# Patient Record
Sex: Male | Born: 1965 | Race: White | Hispanic: No | Marital: Married | Smoking: Former smoker
Health system: Southern US, Community
[De-identification: ages and names within clinical notes are randomized; demographics above are authoritative.]

## PROBLEM LIST (undated history)

## (undated) DIAGNOSIS — I639 Cerebral infarction, unspecified: Secondary | ICD-10-CM

## (undated) DIAGNOSIS — F32A Depression, unspecified: Secondary | ICD-10-CM

## (undated) DIAGNOSIS — F329 Major depressive disorder, single episode, unspecified: Secondary | ICD-10-CM

## (undated) DIAGNOSIS — Q631 Lobulated, fused and horseshoe kidney: Secondary | ICD-10-CM

## (undated) DIAGNOSIS — I6529 Occlusion and stenosis of unspecified carotid artery: Secondary | ICD-10-CM

## (undated) HISTORY — PX: GANGLION CYST EXCISION: SHX1691

## (undated) HISTORY — DX: Occlusion and stenosis of unspecified carotid artery: I65.29

## (undated) HISTORY — DX: Depression, unspecified: F32.A

## (undated) HISTORY — PX: CRANIOTOMY: SHX93

## (undated) HISTORY — DX: Lobulated, fused and horseshoe kidney: Q63.1

---
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
## 2011-01-30 DIAGNOSIS — J159 Unspecified bacterial pneumonia: Secondary | ICD-10-CM | POA: Insufficient documentation

## 2011-12-26 DIAGNOSIS — IMO0002 Reserved for concepts with insufficient information to code with codable children: Secondary | ICD-10-CM | POA: Insufficient documentation

## 2014-06-15 DIAGNOSIS — G4700 Insomnia, unspecified: Secondary | ICD-10-CM | POA: Insufficient documentation

## 2016-06-09 DIAGNOSIS — F411 Generalized anxiety disorder: Secondary | ICD-10-CM | POA: Insufficient documentation

## 2016-06-09 DIAGNOSIS — Z6821 Body mass index (BMI) 21.0-21.9, adult: Secondary | ICD-10-CM | POA: Insufficient documentation

## 2016-06-09 DIAGNOSIS — M25562 Pain in left knee: Secondary | ICD-10-CM | POA: Insufficient documentation

## 2016-06-09 DIAGNOSIS — I10 Essential (primary) hypertension: Secondary | ICD-10-CM | POA: Insufficient documentation

## 2016-06-09 DIAGNOSIS — Z0000 Encounter for general adult medical examination without abnormal findings: Secondary | ICD-10-CM | POA: Insufficient documentation

## 2017-03-16 DIAGNOSIS — I639 Cerebral infarction, unspecified: Secondary | ICD-10-CM

## 2017-03-16 DIAGNOSIS — I610 Nontraumatic intracerebral hemorrhage in hemisphere, subcortical: Secondary | ICD-10-CM | POA: Insufficient documentation

## 2017-03-16 HISTORY — DX: Cerebral infarction, unspecified: I63.9

## 2017-03-17 DIAGNOSIS — I161 Hypertensive emergency: Secondary | ICD-10-CM | POA: Insufficient documentation

## 2017-03-17 DIAGNOSIS — G936 Cerebral edema: Secondary | ICD-10-CM | POA: Insufficient documentation

## 2017-03-26 DIAGNOSIS — E440 Moderate protein-calorie malnutrition: Secondary | ICD-10-CM | POA: Insufficient documentation

## 2017-03-30 DIAGNOSIS — L03113 Cellulitis of right upper limb: Secondary | ICD-10-CM | POA: Insufficient documentation

## 2017-05-19 DIAGNOSIS — I639 Cerebral infarction, unspecified: Secondary | ICD-10-CM | POA: Insufficient documentation

## 2017-08-26 DIAGNOSIS — J069 Acute upper respiratory infection, unspecified: Secondary | ICD-10-CM | POA: Insufficient documentation

## 2017-10-08 DIAGNOSIS — R600 Localized edema: Secondary | ICD-10-CM | POA: Insufficient documentation

## 2018-01-05 DIAGNOSIS — L0231 Cutaneous abscess of buttock: Secondary | ICD-10-CM | POA: Insufficient documentation

## 2018-07-02 DIAGNOSIS — B370 Candidal stomatitis: Secondary | ICD-10-CM | POA: Insufficient documentation

## 2018-07-09 DIAGNOSIS — R059 Cough, unspecified: Secondary | ICD-10-CM | POA: Insufficient documentation

## 2018-11-02 ENCOUNTER — Encounter: Payer: Self-pay | Admitting: Neurology

## 2018-11-02 ENCOUNTER — Encounter: Payer: Self-pay | Admitting: *Deleted

## 2018-11-02 ENCOUNTER — Other Ambulatory Visit: Payer: Self-pay

## 2018-11-02 ENCOUNTER — Telehealth: Payer: Self-pay | Admitting: Neurology

## 2018-11-02 ENCOUNTER — Ambulatory Visit: Payer: Managed Care, Other (non HMO) | Admitting: Neurology

## 2018-11-02 VITALS — BP 115/79 | HR 55 | Temp 99.3°F

## 2018-11-02 DIAGNOSIS — I69354 Hemiplegia and hemiparesis following cerebral infarction affecting left non-dominant side: Secondary | ICD-10-CM

## 2018-11-02 DIAGNOSIS — G8114 Spastic hemiplegia affecting left nondominant side: Secondary | ICD-10-CM | POA: Insufficient documentation

## 2018-11-02 NOTE — Telephone Encounter (Signed)
4 weeks emg guided botox

## 2018-11-02 NOTE — Progress Notes (Signed)
PATIENT: David Powers DOB: 06-May-1965  Chief Complaint  Patient presents with  . History of CVA    He is here with his wife, Katharine Look. He would like to discuss possible Botox treatment for his left, upper extremity spasticity.   Marland Kitchen PCP    Lanelle Bal, PA-C     HISTORICAL  David Powers is a 53 year old male, seen in request by primary care physician PA Lanelle Bal for evaluation of EMG guided botulism toxin injection for spastic left upper lower extremity, he is accompanied by his wife center at today's visit on November 02, 2018.  I have reviewed and summarized the referring note from the referring physician.  He had past medical history of hypertension, depression, suffered right hemisphere hemorrhagic stroke, presented with sudden onset left-sided weakness on March 16, 2017, was [facility] at [facility], right craniotomy, I reviewed MRI report in April 2019, right side craniotomy, large area of encephalomalacia centered within the right corona radiata with extension into the centrum semiovale and into the lentiform nucleus, external capsule, corresponding to site of previous parenchymal hemorrhage, moderate cerebellar and mild cerebral atrophy, large superficial cystic structure lateral at the right neck reflex sebaceous cyst  He has significant spastic hemiparesis, wheelchair-bound, needing help transfer, no significant movement of left upper extremity, he also complains of constant pain of left shoulder, elbow, hand, left lower extremity,  He is taking baclofen 10 mg 4 times a day, gabapentin 100 mg 3 times a day with some help,   REVIEW OF SYSTEMS: Full 14 system review of systems performed and notable only for as above All other review of systems were negative.  ALLERGIES: Allergies  Allergen Reactions  . Other Anaphylaxis    Itchy throat Hazelnuts  . Shellfish Allergy Anaphylaxis    HOME MEDICATIONS: Current Outpatient Medications  Medication Sig Dispense  Refill  . amLODipine (NORVASC) 10 MG tablet Take 10 mg by mouth daily.    . baclofen (LIORESAL) 10 MG tablet Take 10 mg by mouth 4 (four) times daily.    . Flaxseed, Linseed, (FLAXSEED OIL PO) Take by mouth daily.    Marland Kitchen FLUoxetine (PROZAC) 40 MG capsule Take by mouth daily.    . furosemide (LASIX) 40 MG tablet Take 40 mg by mouth daily.    Marland Kitchen gabapentin (NEURONTIN) 100 MG capsule Take by mouth.    Marland Kitchen HYDROcodone-acetaminophen (NORCO) 10-325 MG tablet Take 1 tablet by mouth 2 (two) times daily.    . traZODone (DESYREL) 100 MG tablet TAKE 1 TABLET BY MOUTH EVERYDAY AT BEDTIME     No current facility-administered medications for this visit.     PAST MEDICAL HISTORY: Past Medical History:  Diagnosis Date  . Depression   . Horseshoe kidney   . Stroke (cerebrum) ([place]) 03/16/2017    PAST SURGICAL HISTORY: Past Surgical History:  Procedure Laterality Date  . CRANIOTOMY     right frontal lobe  . GANGLION CYST EXCISION Left     FAMILY HISTORY: Family History  Problem Relation Age of Onset  . Heart disease Mother   . Other Father        auto accident  . Lymphoma Maternal Grandmother     SOCIAL HISTORY: Social History   Socioeconomic History  . Marital status: Married    Spouse name: Not on file  . Number of children: 1  . Years of education: college  . Highest education level: Not on file  Occupational History  . Occupation: Disabled  Social Needs  . Financial  resource strain: Not on file  . Food insecurity    Worry: Not on file    Inability: Not on file  . Transportation needs    Medical: Not on file    Non-medical: Not on file  Tobacco Use  . Smoking status: Former Smoker    Quit date: 2018    Years since quitting: 2.6  . Smokeless tobacco: Never Used  . Tobacco comment: previously smoked 2 ppd  Substance and Sexual Activity  . Alcohol use: Not Currently  . Drug use: Never  . Sexual activity: Not on file  Lifestyle  . Physical activity    Days per week: Not on  file    Minutes per session: Not on file  . Stress: Not on file  Relationships  . Social Herbalist on phone: Not on file    Gets together: Not on file    Attends religious service: Not on file    Active member of club or organization: Not on file    Attends meetings of clubs or organizations: Not on file    Relationship status: Not on file  . Intimate partner violence    Fear of current or ex partner: Not on file    Emotionally abused: Not on file    Physically abused: Not on file    Forced sexual activity: Not on file  Other Topics Concern  . Not on file  Social History Narrative   Lives at home with his wife.   Right-handed.   No caffeine per day.     PHYSICAL EXAM   Vitals:   11/02/18 1325  BP: 115/79  Pulse: (!) 55  Temp: 99.3 F (37.4 C)    Not recorded      There is no height or weight on file to calculate BMI.  PHYSICAL EXAMNIATION:  Gen: NAD, conversant, well nourised, obese, well groomed                     Cardiovascular: Regular rate rhythm, no peripheral edema, warm, nontender. Eyes: Conjunctivae clear without exudates or hemorrhage Neck: Supple, no carotid bruits. Pulmonary: Clear to auscultation bilaterally   NEUROLOGICAL EXAM:  MENTAL STATUS: Speech:    Speech is normal; fluent and spontaneous with normal comprehension.  Cognition:     Orientation to time, place and person     Normal recent and remote memory     Normal Attention span and concentration     Normal Language, naming, repeating,spontaneous speech     Fund of knowledge   CRANIAL NERVES: CN II: Visual fields are full to confrontation.  Pupils are round equal and briskly reactive to light. CN III, IV, VI: extraocular movement are normal. No ptosis. CN V: Facial sensation is intact to pinprick in all 3 divisions bilaterally. Corneal responses are intact.  CN VII: Face is symmetric with normal eye closure and smile. CN VIII: Hearing is normal to rubbing fingers CN IX,  X: Palate elevates symmetrically. Phonation is normal. CN XI: Head turning and shoulder shrug are intact CN XII: Tongue is midline with normal movements and no atrophy.  MOTOR: Spastic left hemiparesis, no significant left upper extremity, with passive movement left shoulder maximum 80 degrees, left elbow pronation flexion, maximum 170 degrees, wrist flexion thumb in position, with passive movement, near normal range of motion of wrist extension, finger extension,  Antigravity movement of left lower extremity, left ankle dorsiflexion weakness,  REFLEXES: Hyperreflexia on the left upper and lower  extremity  SENSORY: Intact to light touch  COORDINATION: Rapid alternating movements and fine finger movements are intact. There is no dysmetria on finger-to-nose and heel-knee-shin.    GAIT/STANCE: Deferred  DIAGNOSTIC DATA (LABS, IMAGING, TESTING) - I reviewed patient records, labs, notes, testing and imaging myself where available.   ASSESSMENT AND PLAN  Pesach Frisch is a 54 y.o. male   Spastic left hemiparesis  From large right hemisphere hemorrhagic stroke on March 16, 2017  Electrical stimulation guided Xeomin injection, asking for 600 units  Return to clinic in 1 months for injection  Marcial Pacas, M.D. Ph.D.  [facility] [address] [place], [place] [address] Ph: [phone] Fax: [phone]  CC: Lanelle Bal, [place]-

## 2018-11-03 NOTE — Telephone Encounter (Signed)
I called to schedule the patient but he did not answer so I left a VM asking him to call me back. DW

## 2018-11-24 NOTE — Telephone Encounter (Signed)
SY:[id number] (11/24/19). DW

## 2018-11-25 ENCOUNTER — Other Ambulatory Visit: Payer: Self-pay

## 2018-11-25 ENCOUNTER — Ambulatory Visit: Payer: Managed Care, Other (non HMO) | Admitting: Neurology

## 2018-11-25 VITALS — BP 129/76 | HR 64 | Temp 98.6°F

## 2018-11-25 DIAGNOSIS — I69354 Hemiplegia and hemiparesis following cerebral infarction affecting left non-dominant side: Secondary | ICD-10-CM

## 2018-11-25 MED ORDER — INCOBOTULINUMTOXINA 100 UNITS IM SOLR: 100.0000 [IU] | INTRAMUSCULAR | Status: DC

## 2018-11-25 MED ORDER — INCOBOTULINUMTOXINA 100 UNITS IM SOLR
600.0000 [IU] | INTRAMUSCULAR | Status: AC
  Administered 2018-11-25: 18:00:00 600 [IU] via INTRAMUSCULAR

## 2018-11-25 NOTE — Progress Notes (Signed)
PATIENT: David Powers DOB: 04/24/1965  Chief Complaint  Patient presents with  . xeomin    RM 13. Approved for 600U Buy/Bill. Left spastic hemiplegia.     HISTORICAL  David Powers is a 53 year old male, seen in request by primary care physician PA Lanelle Bal for evaluation of EMG guided botulism toxin injection for spastic left upper lower extremity, he is accompanied by his wife center at today's visit on November 02, 2018.  I have reviewed and summarized the referring note from the referring physician.  He had past medical history of hypertension, depression, suffered right hemisphere hemorrhagic stroke, presented with sudden onset left-sided weakness on March 16, 2017, was treated at [facility], right craniotomy, I reviewed MRI report in April 2019, right side craniotomy, large area of encephalomalacia centered within the right corona radiata with extension into the centrum semiovale and into the lentiform nucleus, external capsule, corresponding to site of previous parenchymal hemorrhage, moderate cerebellar and mild cerebral atrophy, large superficial cystic structure lateral at the right neck reflex sebaceous cyst  He has significant spastic hemiparesis, wheelchair-bound, needing help transfer, no significant movement of left upper extremity, he also complains of constant pain of left shoulder, elbow, hand, left lower extremity,  He is taking baclofen 10 mg 4 times a day, gabapentin 100 mg 3 times a day with some help,  Update November 25, 2018: This is his first electrical stimulation guided botulism toxin injection for spastic left upper and lower extremity spasticity, he complains significant left shoulder, elbow, wrist, finger pain with passive stretch, also have significant left knee, hamstring muscle tightness, we used Xeomin 600 units today  REVIEW OF SYSTEMS: Full 14 system review of systems performed and notable only for as above All other review of systems  were negative.  ALLERGIES: Allergies  Allergen Reactions  . Other Anaphylaxis    Itchy throat Hazelnuts  . Shellfish Allergy Anaphylaxis    HOME MEDICATIONS: Current Outpatient Medications  Medication Sig Dispense Refill  . amLODipine (NORVASC) 10 MG tablet Take 10 mg by mouth daily.    . baclofen (LIORESAL) 10 MG tablet Take 10 mg by mouth 4 (four) times daily.    . Flaxseed, Linseed, (FLAXSEED OIL PO) Take by mouth daily.    Marland Kitchen FLUoxetine (PROZAC) 40 MG capsule Take by mouth daily.    . furosemide (LASIX) 40 MG tablet Take 40 mg by mouth daily.    Marland Kitchen gabapentin (NEURONTIN) 100 MG capsule Take by mouth.    Marland Kitchen HYDROcodone-acetaminophen (NORCO) 10-325 MG tablet Take 1 tablet by mouth 2 (two) times daily.    . traZODone (DESYREL) 100 MG tablet TAKE 1 TABLET BY MOUTH EVERYDAY AT BEDTIME     No current facility-administered medications for this visit.     PAST MEDICAL HISTORY: Past Medical History:  Diagnosis Date  . Depression   . Horseshoe kidney   . Stroke (cerebrum) ([place]) 03/16/2017    PAST SURGICAL HISTORY: Past Surgical History:  Procedure Laterality Date  . CRANIOTOMY     right frontal lobe  . GANGLION CYST EXCISION Left     FAMILY HISTORY: Family History  Problem Relation Age of Onset  . Heart disease Mother   . Other Father        auto accident  . Lymphoma Maternal Grandmother     SOCIAL HISTORY: Social History   Socioeconomic History  . Marital status: Married    Spouse name: Not on file  . Number of children: 1  . Years  of education: college  . Highest education level: Not on file  Occupational History  . Occupation: Disabled  Social Needs  . Financial resource strain: Not on file  . Food insecurity    Worry: Not on file    Inability: Not on file  . Transportation needs    Medical: Not on file    Non-medical: Not on file  Tobacco Use  . Smoking status: Former Smoker    Quit date: 2018    Years since quitting: 2.6  . Smokeless tobacco: Never  Used  . Tobacco comment: previously smoked 2 ppd  Substance and Sexual Activity  . Alcohol use: Not Currently  . Drug use: Never  . Sexual activity: Not on file  Lifestyle  . Physical activity    Days per week: Not on file    Minutes per session: Not on file  . Stress: Not on file  Relationships  . Social Herbalist on phone: Not on file    Gets together: Not on file    Attends religious service: Not on file    Active member of club or organization: Not on file    Attends meetings of clubs or organizations: Not on file    Relationship status: Not on file  . Intimate partner violence    Fear of current or ex partner: Not on file    Emotionally abused: Not on file    Physically abused: Not on file    Forced sexual activity: Not on file  Other Topics Concern  . Not on file  Social History Narrative   Lives at home with his wife.   Right-handed.   No caffeine per day.     PHYSICAL EXAM   Vitals:   11/25/18 1030  BP: 129/76  Pulse: 64  Temp: 98.6 F (37 C)    Not recorded      There is no height or weight on file to calculate BMI.  PHYSICAL EXAMNIATION:  Gen: NAD, conversant, well nourised, well groomed                     Cardiovascular: Regular rate rhythm, no peripheral edema, warm, nontender. Eyes: Conjunctivae clear without exudates or hemorrhage Neck: Supple, no carotid bruits. Pulmonary: Clear to auscultation bilaterally   NEUROLOGICAL EXAM:  MENTAL STATUS: Speech:    Speech is normal; fluent and spontaneous with normal comprehension.  Cognition:     Orientation to time, place and person     Normal recent and remote memory     Normal Attention span and concentration     Normal Language, naming, repeating,spontaneous speech     Fund of knowledge   CRANIAL NERVES: CN II: Visual fields are full to confrontation.  Pupils are round equal and briskly reactive to light. CN III, IV, VI: extraocular movement are normal. No ptosis. CN V: Facial  sensation is intact to pinprick in all 3 divisions bilaterally. Corneal responses are intact.  CN VII: Face is symmetric with normal eye closure and smile. CN VIII: Hearing is normal to casual conversation CN IX, X: Palate elevates symmetrically. Phonation is normal. CN XI: Head turning and shoulder shrug are intact CN XII: Tongue is midline with normal movements and no atrophy.  MOTOR: Spastic left hemiparesis, no significant left upper extremity spontaneous movement, with passive movement left shoulder maximum 80 degrees, left elbow pronation flexion, maximum 170 degrees, wrist flexion thumb in position, with passive movement, near normal range of motion of  wrist and finger  Antigravity movement of left lower extremity, complains of left knee pain, left hamstring muscle tightness left ankle dorsiflexion weakness,  REFLEXES: Hyperreflexia on the left upper and lower extremity  SENSORY: Intact to light touch  COORDINATION: Rapid alternating movements and fine finger movements are intact. There is no dysmetria on finger-to-nose and heel-knee-shin.    GAIT/STANCE: Deferred  DIAGNOSTIC DATA (LABS, IMAGING, TESTING) - I reviewed patient records, labs, notes, testing and imaging myself where available.   ASSESSMENT AND PLAN  David Powers is a 53 y.o. male   Spastic left hemiparesis  From large right hemisphere hemorrhagic stroke on March 16, 2017  Electrical stimulation guided Xeomin injection, use 600 units today   Left pectoralis major 100 units Left latissimus dorsi 100 units Left brachialis 100 units  Left pronator teres 50 units Left flexor digitorum profundus 50 units Left flexor digitorum superficialis 50 units Left palmar longus 50 units Left flexor carpi ulnar wrist 50 units Left flexor carpi radialis 50 units  He tolerated injection well will return to clinic in 3 months for repeat injection  Marcial Pacas, M.D. Ph.D.  [facility] [address], [place], [place] [address] Ph: [phone] Fax: [phone]  CC: Lanelle Bal, [place]-

## 2019-03-03 ENCOUNTER — Ambulatory Visit: Payer: Self-pay | Admitting: Neurology

## 2019-08-17 DIAGNOSIS — F32A Depression, unspecified: Secondary | ICD-10-CM | POA: Insufficient documentation

## 2019-08-18 DIAGNOSIS — R5383 Other fatigue: Secondary | ICD-10-CM | POA: Insufficient documentation

## 2019-12-26 ENCOUNTER — Other Ambulatory Visit: Payer: Self-pay | Admitting: Otolaryngology

## 2019-12-26 ENCOUNTER — Other Ambulatory Visit (HOSPITAL_COMMUNITY): Payer: Self-pay | Admitting: Otolaryngology

## 2019-12-26 DIAGNOSIS — R221 Localized swelling, mass and lump, neck: Secondary | ICD-10-CM

## 2020-01-18 ENCOUNTER — Ambulatory Visit (HOSPITAL_COMMUNITY): Payer: Medicare Other

## 2020-01-27 ENCOUNTER — Ambulatory Visit (HOSPITAL_COMMUNITY)
Admission: RE | Admit: 2020-01-27 | Discharge: 2020-01-27 | Disposition: A | Discharge status: Home / Self Care | Payer: Medicare Other | Source: Ambulatory Visit | Attending: Otolaryngology | Admitting: Otolaryngology

## 2020-01-27 ENCOUNTER — Other Ambulatory Visit: Payer: Self-pay

## 2020-01-27 DIAGNOSIS — R221 Localized swelling, mass and lump, neck: Secondary | ICD-10-CM | POA: Diagnosis not present

## 2020-01-27 DIAGNOSIS — I6521 Occlusion and stenosis of right carotid artery: Secondary | ICD-10-CM | POA: Diagnosis not present

## 2020-01-27 LAB — POCT I-STAT CREATININE: Creatinine, Ser: 0.8 mg/dL (ref 0.61–1.24)

## 2020-01-27 MED ORDER — IOHEXOL 300 MG/ML  SOLN
75.0000 mL | Freq: Once | INTRAMUSCULAR | Status: AC | PRN
  Administered 2020-01-27: 75 mL via INTRAVENOUS

## 2020-02-01 DIAGNOSIS — R221 Localized swelling, mass and lump, neck: Secondary | ICD-10-CM | POA: Diagnosis not present

## 2020-02-01 DIAGNOSIS — H9313 Tinnitus, bilateral: Secondary | ICD-10-CM | POA: Diagnosis not present

## 2020-02-01 DIAGNOSIS — H903 Sensorineural hearing loss, bilateral: Secondary | ICD-10-CM | POA: Diagnosis not present

## 2020-02-21 DIAGNOSIS — L723 Sebaceous cyst: Secondary | ICD-10-CM | POA: Diagnosis not present

## 2020-02-21 DIAGNOSIS — I10 Essential (primary) hypertension: Secondary | ICD-10-CM | POA: Diagnosis not present

## 2020-02-21 DIAGNOSIS — M25562 Pain in left knee: Secondary | ICD-10-CM | POA: Diagnosis not present

## 2020-02-21 DIAGNOSIS — I69354 Hemiplegia and hemiparesis following cerebral infarction affecting left non-dominant side: Secondary | ICD-10-CM | POA: Diagnosis not present

## 2020-02-21 DIAGNOSIS — I639 Cerebral infarction, unspecified: Secondary | ICD-10-CM | POA: Diagnosis not present

## 2020-02-21 DIAGNOSIS — M25561 Pain in right knee: Secondary | ICD-10-CM | POA: Diagnosis not present

## 2020-03-12 DIAGNOSIS — Z8673 Personal history of transient ischemic attack (TIA), and cerebral infarction without residual deficits: Secondary | ICD-10-CM | POA: Diagnosis not present

## 2020-03-12 DIAGNOSIS — I10 Essential (primary) hypertension: Secondary | ICD-10-CM | POA: Diagnosis not present

## 2020-03-12 DIAGNOSIS — Z79899 Other long term (current) drug therapy: Secondary | ICD-10-CM | POA: Diagnosis not present

## 2020-03-12 DIAGNOSIS — Z743 Need for continuous supervision: Secondary | ICD-10-CM | POA: Diagnosis not present

## 2020-03-12 DIAGNOSIS — Z87891 Personal history of nicotine dependence: Secondary | ICD-10-CM | POA: Diagnosis not present

## 2020-03-12 DIAGNOSIS — K760 Fatty (change of) liver, not elsewhere classified: Secondary | ICD-10-CM | POA: Diagnosis not present

## 2020-03-12 DIAGNOSIS — Q631 Lobulated, fused and horseshoe kidney: Secondary | ICD-10-CM | POA: Diagnosis not present

## 2020-03-12 DIAGNOSIS — R531 Weakness: Secondary | ICD-10-CM | POA: Diagnosis not present

## 2020-03-12 DIAGNOSIS — N200 Calculus of kidney: Secondary | ICD-10-CM | POA: Diagnosis not present

## 2020-03-12 DIAGNOSIS — E876 Hypokalemia: Secondary | ICD-10-CM | POA: Diagnosis not present

## 2020-03-12 DIAGNOSIS — R509 Fever, unspecified: Secondary | ICD-10-CM | POA: Diagnosis not present

## 2020-03-12 DIAGNOSIS — I700 Atherosclerosis of aorta: Secondary | ICD-10-CM | POA: Diagnosis not present

## 2020-03-12 DIAGNOSIS — E860 Dehydration: Secondary | ICD-10-CM | POA: Diagnosis not present

## 2020-03-12 DIAGNOSIS — R5381 Other malaise: Secondary | ICD-10-CM | POA: Diagnosis not present

## 2020-03-12 DIAGNOSIS — R339 Retention of urine, unspecified: Secondary | ICD-10-CM | POA: Diagnosis not present

## 2020-03-15 DIAGNOSIS — Z7689 Persons encountering health services in other specified circumstances: Secondary | ICD-10-CM | POA: Diagnosis not present

## 2020-03-15 DIAGNOSIS — E876 Hypokalemia: Secondary | ICD-10-CM | POA: Diagnosis not present

## 2020-03-15 DIAGNOSIS — R339 Retention of urine, unspecified: Secondary | ICD-10-CM | POA: Diagnosis not present

## 2020-03-15 DIAGNOSIS — Z1389 Encounter for screening for other disorder: Secondary | ICD-10-CM | POA: Diagnosis not present

## 2020-03-15 DIAGNOSIS — K029 Dental caries, unspecified: Secondary | ICD-10-CM | POA: Diagnosis not present

## 2020-03-15 DIAGNOSIS — S22000A Wedge compression fracture of unspecified thoracic vertebra, initial encounter for closed fracture: Secondary | ICD-10-CM | POA: Diagnosis not present

## 2020-03-22 ENCOUNTER — Encounter: Payer: Self-pay | Admitting: *Deleted

## 2020-04-03 ENCOUNTER — Ambulatory Visit: Payer: Medicare Other | Admitting: Urology

## 2020-04-03 ENCOUNTER — Other Ambulatory Visit: Payer: Self-pay

## 2020-04-03 DIAGNOSIS — I6529 Occlusion and stenosis of unspecified carotid artery: Secondary | ICD-10-CM

## 2020-04-05 ENCOUNTER — Other Ambulatory Visit: Payer: Self-pay

## 2020-04-05 ENCOUNTER — Ambulatory Visit (INDEPENDENT_AMBULATORY_CARE_PROVIDER_SITE_OTHER): Payer: Medicare Other | Admitting: Urology

## 2020-04-05 VITALS — BP 99/69 | HR 58

## 2020-04-05 DIAGNOSIS — R339 Retention of urine, unspecified: Secondary | ICD-10-CM

## 2020-04-05 MED ORDER — TAMSULOSIN HCL 0.4 MG PO CAPS: 0.4000 mg | ORAL_CAPSULE | Freq: Every day | ORAL | 11 refills | Status: DC

## 2020-04-05 NOTE — Progress Notes (Signed)
Fill and Pull Catheter Removal  Patient is present today for a catheter removal.  Patient was cleaned and prepped in a sterile fashion 81ml of sterile water/ saline was instilled into the bladder when the patient felt the urge to urinate. 85ml of water was then drained from the balloon.  A 16FR foley cath was removed from the bladder no complications were noted .  Patient as then given some time to void on their own.  Patient can void  166ml on their own after some time.  Patient tolerated well.  Performed by: Estill Bamberg RN  Follow up/ Additional notes: Monday for PVR    Urological Symptom Review      Patient is experiencing the following symptoms: Trouble starting stream Blood in urine Erection problems (male only)   Review of Systems  Gastrointestinal (upper)  : Negative for upper GI symptoms  Gastrointestinal (lower) : Constipation  Constitutional : Weight loss  Skin: Negative for skin symptoms  Eyes: Negative for eye symptoms  Ear/Nose/Throat : Negative for Ear/Nose/Throat symptoms  Hematologic/Lymphatic: Negative for Hematologic/Lymphatic symptoms  Cardiovascular : Leg swelling  Respiratory : Negative for respiratory symptoms  Endocrine: Negative for endocrine symptoms  Musculoskeletal: Back pain  Neurological: Negative for neurological symptoms  Psychologic: Depression Anxiety

## 2020-04-05 NOTE — Progress Notes (Signed)
04/05/2020 9:51 AM   David Powers Feb 16, 1966 [id number]  Referring provider: Lanelle Bal, PA-C [name],  [place] [address]  Urinary retention  HPI: [name] is a 55yo here for evaluation of urinary retention. He was seen in the ER on 03/12/2020 for urinary retention and a foley was placed at that time. 600cc drained at that time. Patient was dehydrated. Since 2018 when he had his CVA he has had a weak urinary stream, urinary hesitancy, starting/stopping. No nocturia. No urinary urgency or frequency.    PMH: Past Medical History:  Diagnosis Date  . Depression   . Horseshoe kidney   . Stroke (cerebrum) ([place]) 03/16/2017    Surgical History: Past Surgical History:  Procedure Laterality Date  . CRANIOTOMY     right frontal lobe  . GANGLION CYST EXCISION Left     Home Medications:  Allergies as of 04/05/2020      Reactions   Other Anaphylaxis   Itchy throat Hazelnuts   Shellfish Allergy Anaphylaxis   Shrimp Extract Allergy Skin Test Anaphylaxis      Medication List       Accurate as of April 05, 2020  9:51 AM. If you have any questions, ask your nurse or doctor.        STOP taking these medications   FLAXSEED OIL PO Stopped by: Nicolette Bang, MD   FLUoxetine 40 MG capsule Commonly known as: PROZAC Stopped by: Nicolette Bang, MD     TAKE these medications   amLODipine 10 MG tablet Commonly known as: NORVASC Take 10 mg by mouth daily.   baclofen 10 MG tablet Commonly known as: LIORESAL Take 10 mg by mouth 4 (four) times daily.   DULoxetine 60 MG capsule Commonly known as: CYMBALTA Cymbalta 60 mg capsule,delayed release   ELIQUIS PO Take by mouth.   furosemide 40 MG tablet Commonly known as: LASIX Take 40 mg by mouth daily.   gabapentin 100 MG capsule Commonly known as: NEURONTIN Take by mouth.   HYDROcodone-acetaminophen 10-325 MG tablet Commonly known as: NORCO Take 1 tablet by mouth 2 (two) times daily.   Metoprolol  Succinate 50 MG Cs24 metoprolol succinate ER 50 mg capsule sprinkle, ext. release 24 hr   traZODone 100 MG tablet Commonly known as: DESYREL TAKE 1 TABLET BY MOUTH EVERYDAY AT BEDTIME       Allergies:  Allergies  Allergen Reactions  . Other Anaphylaxis    Itchy throat Hazelnuts  . Shellfish Allergy Anaphylaxis  . Shrimp Extract Allergy Skin Test Anaphylaxis    Family History: Family History  Problem Relation Age of Onset  . Heart disease Mother   . Other Father        auto accident  . Lymphoma Maternal Grandmother     Social History:  reports that he quit smoking about 4 years ago. He has never used smokeless tobacco. He reports previous alcohol use. He reports that he does not use drugs.  ROS: All other review of systems were reviewed and are negative except what is noted above in HPI  Physical Exam: BP 99/69   Pulse (!) 58   Constitutional:  Alert and oriented, No acute distress. HEENT:  AT, moist mucus membranes.  Trachea midline, no masses. Cardiovascular: No clubbing, cyanosis, or edema. Respiratory: Normal respiratory effort, no increased work of breathing. GI: Abdomen is soft, nontender, nondistended, no abdominal masses GU: No CVA tenderness.  Lymph: No cervical or inguinal lymphadenopathy. Skin: No rashes, bruises or suspicious lesions. Neurologic:  Grossly intact, no focal deficits, moving all 4 extremities. Psychiatric: Normal mood and affect.  Laboratory Data: No results found for: WBC, HGB, HCT, MCV, PLT  Lab Results  Component Value Date   CREATININE 0.80 01/27/2020    No results found for: PSA  No results found for: TESTOSTERONE  No results found for: HGBA1C  Urinalysis No results found for: COLORURINE, APPEARANCEUR, LABSPEC, PHURINE, GLUCOSEU, HGBUR, BILIRUBINUR, KETONESUR, PROTEINUR, UROBILINOGEN, NITRITE, LEUKOCYTESUR  No results found for: LABMICR, WBCUA, RBCUA, LABEPIT, MUCUS, BACTERIA  Pertinent Imaging:  No results found for  this or any previous visit.  No results found for this or any previous visit.  No results found for this or any previous visit.  No results found for this or any previous visit.  No results found for this or any previous visit.  No results found for this or any previous visit.  No results found for this or any previous visit.  No results found for this or any previous visit.   Assessment & Plan:    1. Urinary retention -We will start flomax 0.4mg  daily -Voiding trial passed today. RTC 3 days for PVR   No follow-ups on file.  Nicolette Bang, MD  [facility] [place]

## 2020-04-05 NOTE — Patient Instructions (Signed)
Acute Urinary Retention, Male  Acute urinary retention is when a person cannot pee (urinate) at all, or can only pee a little. This can come on all of a sudden. If it is not treated, it can lead to kidney problems or other serious problems. What are the causes?  A problem with the tube that drains the bladder (urethra).  Problems with the nerves in the bladder.  Tumors.  Certain medicines.  An infection.  Having trouble pooping (constipation). What increases the risk? Older men are more at risk because their prostate gland may become larger as they age. Other conditions also can increase risk. These include:  Diseases, such as multiple sclerosis.  Injury to the spinal cord.  Diabetes.  A condition that affects the way the brain works, such as dementia.  Holding back urine due to trauma or because you do not want to use the bathroom. What are the signs or symptoms?  Trouble peeing.  Pain in the lower belly. How is this treated? Treatment for this condition may include:  Medicines.  Placing a thin, germ-free tube (catheter) into the bladder to drain pee out of the body.  Therapy to treat mental health conditions.  Treatment for conditions that may cause this. If needed, you may be treated in the hospital for kidney problems or to manage other problems. Follow these instructions at home: Medicines  Take over-the-counter and prescription medicines only as told by your doctor. Ask your doctor what medicines you should stay away from.  If you were given an antibiotic medicine, take it as told by your doctor. Do not stop taking it, even if you start to feel better. General instructions  Do not smoke or use any products that contain nicotine or tobacco. If you need help quitting, ask your doctor.  Drink enough fluid to keep your pee pale yellow.  If you were sent home with a tube that drains the bladder, take care of it as told by your doctor.  Watch for changes in  your symptoms. Tell your doctor about them.  If told, keep track of changes in your blood pressure at home. Tell your doctor about them.  Keep all follow-up visits. Contact a doctor if:  You have spasms in your bladder that you cannot stop.  You leak pee when you have spasms. Get help right away if:  You have chills or a fever.  You have blood in your pee.  You have a tube that drains pee from the bladder and these things happen: ? The tube stops draining pee. ? The tube falls out. Summary  Acute urinary retention is when you cannot pee at all or you pee too little.  If this condition is not treated, it can lead to kidney problems or other serious problems.  If you were sent home with a tube (catheter) that drains the bladder, take care of it as told by your doctor.  Watch for changes in your symptoms. Tell your doctor about them. This information is not intended to replace advice given to you by your health care provider. Make sure you discuss any questions you have with your health care provider. Document Revised: 11/23/2019 Document Reviewed: 11/23/2019 Elsevier Patient Education  2021 [employer]vier Inc.  

## 2020-04-06 ENCOUNTER — Ambulatory Visit: Payer: Medicare Other | Admitting: Urology

## 2020-04-09 ENCOUNTER — Ambulatory Visit (INDEPENDENT_AMBULATORY_CARE_PROVIDER_SITE_OTHER): Payer: Medicare Other

## 2020-04-09 ENCOUNTER — Other Ambulatory Visit: Payer: Self-pay

## 2020-04-09 DIAGNOSIS — R339 Retention of urine, unspecified: Secondary | ICD-10-CM | POA: Diagnosis not present

## 2020-04-09 LAB — BLADDER SCAN AMB NON-IMAGING: Scan Result: 237

## 2020-04-09 NOTE — Progress Notes (Signed)
Bladder Scan Patient can void: 237 ml Performed By:  Nakaya Mishkin,lpn   Return Friday for PVR

## 2020-04-10 ENCOUNTER — Encounter: Payer: Self-pay | Admitting: Urology

## 2020-04-13 ENCOUNTER — Ambulatory Visit (INDEPENDENT_AMBULATORY_CARE_PROVIDER_SITE_OTHER): Payer: Medicare Other

## 2020-04-13 ENCOUNTER — Other Ambulatory Visit: Payer: Self-pay

## 2020-04-13 DIAGNOSIS — R339 Retention of urine, unspecified: Secondary | ICD-10-CM

## 2020-04-13 NOTE — Progress Notes (Signed)
Bladder Scan Patient can void: 101 ml Performed By: Estill Bamberg RN   1 month ov with MD PVR

## 2020-04-20 ENCOUNTER — Ambulatory Visit: Payer: Medicare Other | Admitting: Vascular Surgery

## 2020-04-20 ENCOUNTER — Encounter: Payer: Self-pay | Admitting: Vascular Surgery

## 2020-04-20 ENCOUNTER — Ambulatory Visit (HOSPITAL_COMMUNITY)
Admission: RE | Admit: 2020-04-20 | Discharge: 2020-04-20 | Disposition: A | Discharge status: Home / Self Care | Payer: Medicare Other | Source: Ambulatory Visit | Attending: Vascular Surgery | Admitting: Vascular Surgery

## 2020-04-20 ENCOUNTER — Other Ambulatory Visit: Payer: Self-pay

## 2020-04-20 VITALS — BP 125/85 | HR 57 | Temp 97.7°F | Resp 18

## 2020-04-20 DIAGNOSIS — I6529 Occlusion and stenosis of unspecified carotid artery: Secondary | ICD-10-CM | POA: Diagnosis not present

## 2020-04-20 NOTE — Progress Notes (Signed)
Patient ID: David Powers, male   DOB: 12/18/1965, 55 y.o.   MRN: [id number]  Reason for Consult: New Patient (Initial Visit)   Referred by Lanelle Bal, PA-C  Subjective:     HPI:  David Powers is a 55 y.o. male here for evaluation of carotid artery disease.  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  He does have swelling of his left lower extremity currently being treated for DVT in left lower extremity.  He underwent CT scan for cyst in his neck was found to have tight stenosis of the right ICA.  He has not had any new neurologic deficits.  Currently takes Eliquis does not take any antiplatelet agents.  Past Medical History:  Diagnosis Date  . Carotid artery occlusion   . Depression   . Horseshoe kidney   . Stroke (cerebrum) ([place]) 03/16/2017   Family History  Problem Relation Age of Onset  . Heart disease Mother   . Other Father        auto accident  . Lymphoma Maternal Grandmother    Past Surgical History:  Procedure Laterality Date  . CRANIOTOMY     right frontal lobe  . GANGLION CYST EXCISION Left     Short Social History:  Social History   Tobacco Use  . Smoking status: Former Smoker    Quit date: 2018    Years since quitting: 4.0  . Smokeless tobacco: Never Used  . Tobacco comment: previously smoked 2 ppd  Substance Use Topics  . Alcohol use: Not Currently    Allergies  Allergen Reactions  . Other Anaphylaxis    Itchy throat Hazelnuts  . Shellfish Allergy Anaphylaxis  . Shrimp Extract Allergy Skin Test Anaphylaxis    Current Outpatient Medications  Medication Sig Dispense Refill  . amLODipine (NORVASC) 10 MG tablet Take 10 mg by mouth daily.    Marland Kitchen Apixaban (ELIQUIS PO) Take by mouth.    . baclofen (LIORESAL) 10 MG tablet Take 10 mg by mouth 4 (four) times daily.    . DULoxetine (CYMBALTA) 60 MG capsule  Cymbalta 60 mg capsule,delayed release    . furosemide (LASIX) 40 MG tablet Take 40 mg by mouth daily.    Marland Kitchen gabapentin (NEURONTIN) 100 MG capsule Take by mouth.    Marland Kitchen HYDROcodone-acetaminophen (NORCO) 10-325 MG tablet Take 1 tablet by mouth 2 (two) times daily.    . Metoprolol Succinate 50 MG CS24 metoprolol succinate ER 50 mg capsule sprinkle, ext. release 24 hr    . tamsulosin (FLOMAX) 0.4 MG CAPS capsule Take 1 capsule (0.4 mg total) by mouth daily. 30 capsule 11  . traZODone (DESYREL) 100 MG tablet TAKE 1 TABLET BY MOUTH EVERYDAY AT BEDTIME     Current Facility-Administered Medications  Medication Dose Route Frequency Provider Last Rate Last Admin  . incobotulinumtoxinA (XEOMIN) 100 units injection 600 Units  600 Units Intramuscular Q90 days Marcial Pacas, MD   600 Units at 11/25/18 1747    Review of Systems  Constitutional:  Constitutional negative. HENT: HENT negative.  Eyes: Eyes negative.  Respiratory: Respiratory negative.  Cardiovascular: Positive for leg swelling.  GU: Positive for hematuria.  Skin: Skin negative.  Neurological: Positive for focal weakness and numbness.  Hematologic: Hematologic/lymphatic negative.  Psychiatric: Positive for depressed mood.        Objective:  Objective   Vitals:  04/20/20 1138  BP: 125/85  Pulse: (!) 57  Resp: 18  Temp: 97.7 F (36.5 C)  SpO2: 96%   There is no height or weight on file to calculate BMI.  Physical Exam HENT:     Head: Normocephalic.     Nose:     Comments: Wearing a mask Eyes:     Pupils: Pupils are equal, round, and reactive to light.  Neck:     Vascular: No carotid bruit.  Cardiovascular:     Rate and Rhythm: Normal rate.     Heart sounds: Normal heart sounds.  Pulmonary:     Effort: Pulmonary effort is normal.     Breath sounds: Normal breath sounds.  Abdominal:     General: Abdomen is flat.     Palpations: Abdomen is soft. There is no mass.  Musculoskeletal:     Right lower leg: No edema.      Left lower leg: Edema present.  Skin:    General: Skin is warm.     Capillary Refill: Capillary refill takes less than 2 seconds.  Neurological:     Mental Status: He is alert.     Comments: Paralysis left upper and lower extremity  Psychiatric:        Mood and Affect: Mood normal.        Behavior: Behavior normal.        Thought Content: Thought content normal.        Judgment: Judgment normal.     Data: CT IMPRESSION: 1. No evidence of mass or lymphadenopathy. 2. Atherosclerotic disease at the carotid bifurcations. Stenosis of the right ICA in the bulb with minimal diameter of 1.8 mm. This could be a hemodynamically significant stenosis.  Carotid Duplex I have independently interpreted his carotid duplex which demonstrates right-sided 60 to 79% stenosis that is homogeneous and left-sided 1 to 39% stenosis that is heterogeneous.  Bilateral vertebral arteries with antegrade flow     Assessment/Plan:    55 year old male with history of hemorrhagic stroke now with left-sided paralysis.  We discussed with the signs and symptoms of new stroke could be given that he has right-sided moderate grade stenosis 60 to 79% although is homogeneous.  He will follow-up in 1 year with repeat carotid duplex     Waynetta Sandy MD [facility]

## 2020-05-18 ENCOUNTER — Ambulatory Visit: Payer: Medicare Other | Admitting: Urology

## 2020-05-18 DIAGNOSIS — R5383 Other fatigue: Secondary | ICD-10-CM | POA: Diagnosis not present

## 2020-05-18 DIAGNOSIS — I69354 Hemiplegia and hemiparesis following cerebral infarction affecting left non-dominant side: Secondary | ICD-10-CM | POA: Diagnosis not present

## 2020-05-18 DIAGNOSIS — M25572 Pain in left ankle and joints of left foot: Secondary | ICD-10-CM | POA: Diagnosis not present

## 2020-05-18 DIAGNOSIS — I10 Essential (primary) hypertension: Secondary | ICD-10-CM | POA: Diagnosis not present

## 2020-05-18 DIAGNOSIS — M25562 Pain in left knee: Secondary | ICD-10-CM | POA: Diagnosis not present

## 2020-05-18 DIAGNOSIS — G4700 Insomnia, unspecified: Secondary | ICD-10-CM | POA: Diagnosis not present

## 2020-05-18 DIAGNOSIS — Z23 Encounter for immunization: Secondary | ICD-10-CM | POA: Diagnosis not present

## 2020-05-18 DIAGNOSIS — M25561 Pain in right knee: Secondary | ICD-10-CM | POA: Diagnosis not present

## 2020-05-18 DIAGNOSIS — I639 Cerebral infarction, unspecified: Secondary | ICD-10-CM | POA: Diagnosis not present

## 2020-05-18 DIAGNOSIS — R339 Retention of urine, unspecified: Secondary | ICD-10-CM

## 2020-05-18 DIAGNOSIS — L723 Sebaceous cyst: Secondary | ICD-10-CM | POA: Diagnosis not present

## 2020-05-18 DIAGNOSIS — I82402 Acute embolism and thrombosis of unspecified deep veins of left lower extremity: Secondary | ICD-10-CM | POA: Diagnosis not present

## 2020-05-23 ENCOUNTER — Ambulatory Visit: Payer: Medicare Other | Admitting: Urology

## 2020-05-24 ENCOUNTER — Telehealth: Payer: Self-pay

## 2020-05-24 NOTE — Telephone Encounter (Signed)
Patient's wife Katharine Look calling about patients rescheduled appt. Patient had a death in the family and had to cancel his appt on the 9th w/ [facility]. Notes: Return in about 3 days (around 04/08/2020) for [place] PVR. See MD in 4 weeks with PVR.  Please call Katharine Look back at [phone] with an appointment time.  [facility] appointments are full, not sure where the appt can be put.  Thanks, Helene Kelp

## 2020-05-25 NOTE — Telephone Encounter (Signed)
Pts wife notified of new appointment and time.

## 2020-05-29 DIAGNOSIS — G8929 Other chronic pain: Secondary | ICD-10-CM | POA: Diagnosis not present

## 2020-05-29 DIAGNOSIS — M25562 Pain in left knee: Secondary | ICD-10-CM | POA: Diagnosis not present

## 2020-05-29 DIAGNOSIS — M25561 Pain in right knee: Secondary | ICD-10-CM | POA: Diagnosis not present

## 2020-06-04 DIAGNOSIS — R519 Headache, unspecified: Secondary | ICD-10-CM | POA: Diagnosis not present

## 2020-06-04 DIAGNOSIS — Z8673 Personal history of transient ischemic attack (TIA), and cerebral infarction without residual deficits: Secondary | ICD-10-CM | POA: Diagnosis not present

## 2020-06-05 ENCOUNTER — Ambulatory Visit (INDEPENDENT_AMBULATORY_CARE_PROVIDER_SITE_OTHER): Payer: Medicare Other | Admitting: Urology

## 2020-06-05 ENCOUNTER — Encounter: Payer: Self-pay | Admitting: Urology

## 2020-06-05 ENCOUNTER — Other Ambulatory Visit: Payer: Self-pay

## 2020-06-05 VITALS — BP 114/80 | HR 73 | Ht 68.0 in

## 2020-06-05 DIAGNOSIS — N401 Enlarged prostate with lower urinary tract symptoms: Secondary | ICD-10-CM

## 2020-06-05 DIAGNOSIS — R339 Retention of urine, unspecified: Secondary | ICD-10-CM | POA: Diagnosis not present

## 2020-06-05 DIAGNOSIS — N138 Other obstructive and reflux uropathy: Secondary | ICD-10-CM | POA: Diagnosis not present

## 2020-06-05 LAB — BLADDER SCAN AMB NON-IMAGING: Scan Result: >434

## 2020-06-05 MED ORDER — TAMSULOSIN HCL 0.4 MG PO CAPS: 0.4000 mg | ORAL_CAPSULE | Freq: Two times a day (BID) | ORAL | 11 refills | Status: AC

## 2020-06-05 NOTE — Patient Instructions (Signed)

## 2020-06-05 NOTE — Progress Notes (Signed)
06/05/2020 2:52 PM   David Powers 13-May-1965 [id number]  Referring provider: Lanelle Bal, PA-C [address],  [place] [address]  followup urinary retention  HPI: David Powers is a 55yo here for followup for urinary retention. PVR 434cc. He is currently on flomax 0.4mg  daily. He does not feel his bladder is full. He has worsening urinary urgency, frequency, nocturia 3-4x, weak stream. He does not want a foley placed today.    PMH: Past Medical History:  Diagnosis Date  . Carotid artery occlusion   . Depression   . Horseshoe kidney   . Stroke (cerebrum) ([place]) 03/16/2017    Surgical History: Past Surgical History:  Procedure Laterality Date  . CRANIOTOMY     right frontal lobe  . GANGLION CYST EXCISION Left     Home Medications:  Allergies as of 06/05/2020      Reactions   Other Anaphylaxis   Itchy throat Hazelnuts   Shellfish Allergy Anaphylaxis   Shrimp Extract Allergy Skin Test Anaphylaxis      Medication List       Accurate as of June 05, 2020  2:52 PM. If you have any questions, ask your nurse or doctor.        amLODipine 10 MG tablet Commonly known as: NORVASC Take 10 mg by mouth daily.   baclofen 10 MG tablet Commonly known as: LIORESAL Take 10 mg by mouth 4 (four) times daily.   DULoxetine 60 MG capsule Commonly known as: CYMBALTA Cymbalta 60 mg capsule,delayed release   ELIQUIS PO Take by mouth.   Eliquis 5 MG Tabs tablet Generic drug: apixaban   furosemide 40 MG tablet Commonly known as: LASIX Take 40 mg by mouth daily.   gabapentin 100 MG capsule Commonly known as: NEURONTIN Take by mouth.   HYDROcodone-acetaminophen 10-325 MG tablet Commonly known as: NORCO Take 1 tablet by mouth 2 (two) times daily.   Metoprolol Succinate 50 MG Cs24 metoprolol succinate ER 50 mg capsule sprinkle, ext. release 24 hr   tamsulosin 0.4 MG Caps capsule Commonly known as: FLOMAX Take 1 capsule (0.4 mg total) by mouth daily.   traZODone  100 MG tablet Commonly known as: DESYREL TAKE 1 TABLET BY MOUTH EVERYDAY AT BEDTIME       Allergies:  Allergies  Allergen Reactions  . Other Anaphylaxis    Itchy throat Hazelnuts  . Shellfish Allergy Anaphylaxis  . Shrimp Extract Allergy Skin Test Anaphylaxis    Family History: Family History  Problem Relation Age of Onset  . Heart disease Mother   . Other Father        auto accident  . Lymphoma Maternal Grandmother     Social History:  reports that he quit smoking about 4 years ago. He has never used smokeless tobacco. He reports previous alcohol use. He reports that he does not use drugs.  ROS: All other review of systems were reviewed and are negative except what is noted above in HPI  Physical Exam: BP 114/80   Pulse 73   Ht 5\' 8"  (1.727 m)   Constitutional:  Alert and oriented, No acute distress. HEENT: [place] AT, moist mucus membranes.  Trachea midline, no masses. Cardiovascular: No clubbing, cyanosis, or edema. Respiratory: Normal respiratory effort, no increased work of breathing. GI: Abdomen is soft, nontender, nondistended, no abdominal masses GU: No CVA tenderness.  Lymph: No cervical or inguinal lymphadenopathy. Skin: No rashes, bruises or suspicious lesions. Neurologic: Grossly intact, no focal deficits, moving all 4 extremities. Psychiatric: Normal  mood and affect.  Laboratory Data: No results found for: WBC, HGB, HCT, MCV, PLT  Lab Results  Component Value Date   CREATININE 0.80 01/27/2020    No results found for: PSA  No results found for: TESTOSTERONE  No results found for: HGBA1C  Urinalysis No results found for: COLORURINE, APPEARANCEUR, LABSPEC, PHURINE, GLUCOSEU, HGBUR, BILIRUBINUR, KETONESUR, PROTEINUR, UROBILINOGEN, NITRITE, LEUKOCYTESUR  No results found for: LABMICR, WBCUA, RBCUA, LABEPIT, MUCUS, BACTERIA  Pertinent Imaging:  No results found for this or any previous visit.  No results found for this or any previous visit.  No  results found for this or any previous visit.  No results found for this or any previous visit.  No results found for this or any previous visit.  No results found for this or any previous visit.  No results found for this or any previous visit.  No results found for this or any previous visit.   Assessment & Plan:    1. Urinary retention -increase flomax to BID - BLADDER SCAN AMB NON-IMAGING - Urinalysis, Routine w reflex microscopic  2. Benign prostatic hyperplasia with urinary obstruction -Increase flomax to BID -RTC 1 week for PVR   No follow-ups on file.  Nicolette Bang, MD  [facility] [place] ,[place]

## 2020-06-05 NOTE — Progress Notes (Signed)
Bladder Scan Patient cannot void: 434 ml Performed By: Durenda Guthrie, lpn   Urological Symptom Review  Patient is experiencing the following symptoms: Leakage of urine Trouble starting stream Blood in urine   Review of Systems  Gastrointestinal (upper)  : Negative for upper GI symptoms  Gastrointestinal (lower) : Constipation  Constitutional : Weight loss  Skin: Negative for skin symptoms  Eyes: Negative for eye symptoms  Ear/Nose/Throat : Negative for Ear/Nose/Throat symptoms  Hematologic/Lymphatic: Negative for Hematologic/Lymphatic symptoms  Cardiovascular : Leg swelling  Respiratory : Negative for respiratory symptoms  Endocrine: Negative for endocrine symptoms  Musculoskeletal: Back pain Joint pain  Neurological: Headaches  Psychologic: Negative for psychiatric symptoms

## 2020-06-12 ENCOUNTER — Ambulatory Visit: Payer: Medicare Other

## 2020-06-22 ENCOUNTER — Other Ambulatory Visit: Payer: Self-pay

## 2020-06-22 ENCOUNTER — Ambulatory Visit (INDEPENDENT_AMBULATORY_CARE_PROVIDER_SITE_OTHER): Payer: Medicare Other

## 2020-06-22 DIAGNOSIS — R339 Retention of urine, unspecified: Secondary | ICD-10-CM | POA: Diagnosis not present

## 2020-06-22 DIAGNOSIS — N138 Other obstructive and reflux uropathy: Secondary | ICD-10-CM

## 2020-06-22 DIAGNOSIS — N401 Enlarged prostate with lower urinary tract symptoms: Secondary | ICD-10-CM

## 2020-06-22 NOTE — Addendum Note (Signed)
Addended by: Dorisann Frames on: 06/22/2020 10:56 AM   Modules accepted: Orders

## 2020-06-22 NOTE — Progress Notes (Signed)
Bladder Scan Patient can void: 89 ml Performed By: Estill Bamberg RN  Patient will keep scheduled appointment.

## 2020-06-27 DIAGNOSIS — R946 Abnormal results of thyroid function studies: Secondary | ICD-10-CM | POA: Diagnosis not present

## 2020-06-27 DIAGNOSIS — Z1329 Encounter for screening for other suspected endocrine disorder: Secondary | ICD-10-CM | POA: Diagnosis not present

## 2020-08-15 DIAGNOSIS — E440 Moderate protein-calorie malnutrition: Secondary | ICD-10-CM | POA: Diagnosis not present

## 2020-08-15 DIAGNOSIS — I6523 Occlusion and stenosis of bilateral carotid arteries: Secondary | ICD-10-CM | POA: Diagnosis not present

## 2020-08-15 DIAGNOSIS — R6889 Other general symptoms and signs: Secondary | ICD-10-CM | POA: Diagnosis not present

## 2020-08-15 DIAGNOSIS — E43 Unspecified severe protein-calorie malnutrition: Secondary | ICD-10-CM | POA: Diagnosis not present

## 2020-08-15 DIAGNOSIS — J9811 Atelectasis: Secondary | ICD-10-CM | POA: Diagnosis not present

## 2020-08-15 DIAGNOSIS — R188 Other ascites: Secondary | ICD-10-CM | POA: Diagnosis not present

## 2020-08-15 DIAGNOSIS — I82502 Chronic embolism and thrombosis of unspecified deep veins of left lower extremity: Secondary | ICD-10-CM | POA: Diagnosis not present

## 2020-08-15 DIAGNOSIS — D72829 Elevated white blood cell count, unspecified: Secondary | ICD-10-CM | POA: Diagnosis not present

## 2020-08-15 DIAGNOSIS — J0100 Acute maxillary sinusitis, unspecified: Secondary | ICD-10-CM | POA: Diagnosis not present

## 2020-08-15 DIAGNOSIS — Z9889 Other specified postprocedural states: Secondary | ICD-10-CM | POA: Diagnosis not present

## 2020-08-15 DIAGNOSIS — R0902 Hypoxemia: Secondary | ICD-10-CM | POA: Diagnosis not present

## 2020-08-15 DIAGNOSIS — Q631 Lobulated, fused and horseshoe kidney: Secondary | ICD-10-CM | POA: Diagnosis not present

## 2020-08-15 DIAGNOSIS — E8809 Other disorders of plasma-protein metabolism, not elsewhere classified: Secondary | ICD-10-CM | POA: Diagnosis not present

## 2020-08-15 DIAGNOSIS — Z86718 Personal history of other venous thrombosis and embolism: Secondary | ICD-10-CM | POA: Diagnosis not present

## 2020-08-15 DIAGNOSIS — K148 Other diseases of tongue: Secondary | ICD-10-CM | POA: Diagnosis not present

## 2020-08-15 DIAGNOSIS — E786 Lipoprotein deficiency: Secondary | ICD-10-CM | POA: Diagnosis not present

## 2020-08-15 DIAGNOSIS — C770 Secondary and unspecified malignant neoplasm of lymph nodes of head, face and neck: Secondary | ICD-10-CM | POA: Diagnosis not present

## 2020-08-15 DIAGNOSIS — R7881 Bacteremia: Secondary | ICD-10-CM | POA: Diagnosis not present

## 2020-08-15 DIAGNOSIS — K1370 Unspecified lesions of oral mucosa: Secondary | ICD-10-CM | POA: Diagnosis not present

## 2020-08-15 DIAGNOSIS — E860 Dehydration: Secondary | ICD-10-CM | POA: Diagnosis not present

## 2020-08-15 DIAGNOSIS — I69998 Other sequelae following unspecified cerebrovascular disease: Secondary | ICD-10-CM | POA: Diagnosis not present

## 2020-08-15 DIAGNOSIS — N200 Calculus of kidney: Secondary | ICD-10-CM | POA: Diagnosis not present

## 2020-08-15 DIAGNOSIS — B957 Other staphylococcus as the cause of diseases classified elsewhere: Secondary | ICD-10-CM | POA: Diagnosis not present

## 2020-08-15 DIAGNOSIS — Z9911 Dependence on respirator [ventilator] status: Secondary | ICD-10-CM | POA: Diagnosis not present

## 2020-08-15 DIAGNOSIS — C01 Malignant neoplasm of base of tongue: Secondary | ICD-10-CM | POA: Diagnosis not present

## 2020-08-15 DIAGNOSIS — K631 Perforation of intestine (nontraumatic): Secondary | ICD-10-CM | POA: Diagnosis not present

## 2020-08-15 DIAGNOSIS — J90 Pleural effusion, not elsewhere classified: Secondary | ICD-10-CM | POA: Diagnosis not present

## 2020-08-15 DIAGNOSIS — Z79899 Other long term (current) drug therapy: Secondary | ICD-10-CM | POA: Diagnosis not present

## 2020-08-15 DIAGNOSIS — I313 Pericardial effusion (noninflammatory): Secondary | ICD-10-CM | POA: Diagnosis not present

## 2020-08-15 DIAGNOSIS — G9389 Other specified disorders of brain: Secondary | ICD-10-CM | POA: Diagnosis not present

## 2020-08-15 DIAGNOSIS — Z87891 Personal history of nicotine dependence: Secondary | ICD-10-CM | POA: Diagnosis not present

## 2020-08-15 DIAGNOSIS — J9690 Respiratory failure, unspecified, unspecified whether with hypoxia or hypercapnia: Secondary | ICD-10-CM | POA: Diagnosis not present

## 2020-08-15 DIAGNOSIS — E8770 Fluid overload, unspecified: Secondary | ICD-10-CM | POA: Diagnosis not present

## 2020-08-15 DIAGNOSIS — Z743 Need for continuous supervision: Secondary | ICD-10-CM | POA: Diagnosis not present

## 2020-08-15 DIAGNOSIS — Z8669 Personal history of other diseases of the nervous system and sense organs: Secondary | ICD-10-CM | POA: Diagnosis not present

## 2020-08-15 DIAGNOSIS — K5190 Ulcerative colitis, unspecified, without complications: Secondary | ICD-10-CM | POA: Diagnosis not present

## 2020-08-15 DIAGNOSIS — I517 Cardiomegaly: Secondary | ICD-10-CM | POA: Diagnosis not present

## 2020-08-15 DIAGNOSIS — I10 Essential (primary) hypertension: Secondary | ICD-10-CM | POA: Diagnosis not present

## 2020-08-15 DIAGNOSIS — Z4682 Encounter for fitting and adjustment of non-vascular catheter: Secondary | ICD-10-CM | POA: Diagnosis not present

## 2020-08-15 DIAGNOSIS — Z8673 Personal history of transient ischemic attack (TIA), and cerebral infarction without residual deficits: Secondary | ICD-10-CM | POA: Diagnosis not present

## 2020-08-15 DIAGNOSIS — R778 Other specified abnormalities of plasma proteins: Secondary | ICD-10-CM | POA: Diagnosis not present

## 2020-08-15 DIAGNOSIS — J9601 Acute respiratory failure with hypoxia: Secondary | ICD-10-CM | POA: Diagnosis not present

## 2020-08-15 DIAGNOSIS — E0781 Sick-euthyroid syndrome: Secondary | ICD-10-CM | POA: Diagnosis not present

## 2020-08-15 DIAGNOSIS — M4854XA Collapsed vertebra, not elsewhere classified, thoracic region, initial encounter for fracture: Secondary | ICD-10-CM | POA: Diagnosis not present

## 2020-08-15 DIAGNOSIS — I69954 Hemiplegia and hemiparesis following unspecified cerebrovascular disease affecting left non-dominant side: Secondary | ICD-10-CM | POA: Diagnosis not present

## 2020-08-15 DIAGNOSIS — R790 Abnormal level of blood mineral: Secondary | ICD-10-CM | POA: Diagnosis not present

## 2020-08-15 DIAGNOSIS — E876 Hypokalemia: Secondary | ICD-10-CM | POA: Diagnosis not present

## 2020-08-15 DIAGNOSIS — R946 Abnormal results of thyroid function studies: Secondary | ICD-10-CM | POA: Diagnosis not present

## 2020-08-15 DIAGNOSIS — Z7901 Long term (current) use of anticoagulants: Secondary | ICD-10-CM | POA: Diagnosis not present

## 2020-08-15 DIAGNOSIS — Z681 Body mass index (BMI) 19 or less, adult: Secondary | ICD-10-CM | POA: Diagnosis not present

## 2020-08-15 DIAGNOSIS — D62 Acute posthemorrhagic anemia: Secondary | ICD-10-CM | POA: Diagnosis not present

## 2020-08-15 DIAGNOSIS — J811 Chronic pulmonary edema: Secondary | ICD-10-CM | POA: Diagnosis not present

## 2020-08-15 DIAGNOSIS — Z9049 Acquired absence of other specified parts of digestive tract: Secondary | ICD-10-CM | POA: Diagnosis not present

## 2020-08-15 DIAGNOSIS — K661 Hemoperitoneum: Secondary | ICD-10-CM | POA: Diagnosis not present

## 2020-08-15 DIAGNOSIS — Z66 Do not resuscitate: Secondary | ICD-10-CM | POA: Diagnosis not present

## 2020-08-15 DIAGNOSIS — N179 Acute kidney failure, unspecified: Secondary | ICD-10-CM | POA: Diagnosis not present

## 2020-08-15 DIAGNOSIS — Z452 Encounter for adjustment and management of vascular access device: Secondary | ICD-10-CM | POA: Diagnosis not present

## 2020-08-15 DIAGNOSIS — Z20822 Contact with and (suspected) exposure to covid-19: Secondary | ICD-10-CM | POA: Diagnosis not present

## 2020-08-15 DIAGNOSIS — K029 Dental caries, unspecified: Secondary | ICD-10-CM | POA: Diagnosis not present

## 2020-08-15 DIAGNOSIS — I69354 Hemiplegia and hemiparesis following cerebral infarction affecting left non-dominant side: Secondary | ICD-10-CM | POA: Diagnosis not present

## 2020-08-15 DIAGNOSIS — M549 Dorsalgia, unspecified: Secondary | ICD-10-CM | POA: Diagnosis not present

## 2020-08-15 DIAGNOSIS — K668 Other specified disorders of peritoneum: Secondary | ICD-10-CM | POA: Diagnosis not present

## 2020-08-15 DIAGNOSIS — D649 Anemia, unspecified: Secondary | ICD-10-CM | POA: Diagnosis not present

## 2020-08-15 DIAGNOSIS — K1379 Other lesions of oral mucosa: Secondary | ICD-10-CM | POA: Diagnosis not present

## 2020-08-15 DIAGNOSIS — R0602 Shortness of breath: Secondary | ICD-10-CM | POA: Diagnosis not present

## 2020-08-15 DIAGNOSIS — Z993 Dependence on wheelchair: Secondary | ICD-10-CM | POA: Diagnosis not present

## 2020-08-15 DIAGNOSIS — M4856XA Collapsed vertebra, not elsewhere classified, lumbar region, initial encounter for fracture: Secondary | ICD-10-CM | POA: Diagnosis not present

## 2020-08-15 DIAGNOSIS — K6389 Other specified diseases of intestine: Secondary | ICD-10-CM | POA: Diagnosis not present

## 2020-08-15 DIAGNOSIS — K3532 Acute appendicitis with perforation and localized peritonitis, without abscess: Secondary | ICD-10-CM | POA: Diagnosis not present

## 2020-08-15 DIAGNOSIS — I959 Hypotension, unspecified: Secondary | ICD-10-CM | POA: Diagnosis not present

## 2020-08-16 DIAGNOSIS — J9811 Atelectasis: Secondary | ICD-10-CM | POA: Diagnosis not present

## 2020-08-16 DIAGNOSIS — Z4682 Encounter for fitting and adjustment of non-vascular catheter: Secondary | ICD-10-CM | POA: Diagnosis not present

## 2020-08-16 DIAGNOSIS — I6523 Occlusion and stenosis of bilateral carotid arteries: Secondary | ICD-10-CM | POA: Diagnosis not present

## 2020-08-16 DIAGNOSIS — E786 Lipoprotein deficiency: Secondary | ICD-10-CM | POA: Diagnosis not present

## 2020-08-16 DIAGNOSIS — Z993 Dependence on wheelchair: Secondary | ICD-10-CM | POA: Diagnosis not present

## 2020-08-16 DIAGNOSIS — I69998 Other sequelae following unspecified cerebrovascular disease: Secondary | ICD-10-CM | POA: Diagnosis not present

## 2020-08-16 DIAGNOSIS — Z7901 Long term (current) use of anticoagulants: Secondary | ICD-10-CM | POA: Diagnosis not present

## 2020-08-16 DIAGNOSIS — I959 Hypotension, unspecified: Secondary | ICD-10-CM | POA: Diagnosis not present

## 2020-08-16 DIAGNOSIS — K668 Other specified disorders of peritoneum: Secondary | ICD-10-CM | POA: Diagnosis not present

## 2020-08-16 DIAGNOSIS — Z86718 Personal history of other venous thrombosis and embolism: Secondary | ICD-10-CM | POA: Diagnosis not present

## 2020-08-16 DIAGNOSIS — K1370 Unspecified lesions of oral mucosa: Secondary | ICD-10-CM | POA: Diagnosis not present

## 2020-08-16 DIAGNOSIS — I82502 Chronic embolism and thrombosis of unspecified deep veins of left lower extremity: Secondary | ICD-10-CM | POA: Diagnosis not present

## 2020-08-16 DIAGNOSIS — K029 Dental caries, unspecified: Secondary | ICD-10-CM | POA: Diagnosis not present

## 2020-08-16 DIAGNOSIS — J0100 Acute maxillary sinusitis, unspecified: Secondary | ICD-10-CM | POA: Diagnosis not present

## 2020-08-16 DIAGNOSIS — N179 Acute kidney failure, unspecified: Secondary | ICD-10-CM | POA: Diagnosis not present

## 2020-08-16 DIAGNOSIS — Z9889 Other specified postprocedural states: Secondary | ICD-10-CM | POA: Diagnosis not present

## 2020-08-17 DIAGNOSIS — K148 Other diseases of tongue: Secondary | ICD-10-CM | POA: Diagnosis not present

## 2020-08-17 DIAGNOSIS — I313 Pericardial effusion (noninflammatory): Secondary | ICD-10-CM | POA: Diagnosis not present

## 2020-08-17 DIAGNOSIS — Z86718 Personal history of other venous thrombosis and embolism: Secondary | ICD-10-CM | POA: Diagnosis not present

## 2020-08-17 DIAGNOSIS — K3532 Acute appendicitis with perforation and localized peritonitis, without abscess: Secondary | ICD-10-CM | POA: Diagnosis not present

## 2020-08-17 DIAGNOSIS — J9811 Atelectasis: Secondary | ICD-10-CM | POA: Diagnosis not present

## 2020-08-17 DIAGNOSIS — K668 Other specified disorders of peritoneum: Secondary | ICD-10-CM | POA: Diagnosis not present

## 2020-08-17 DIAGNOSIS — E440 Moderate protein-calorie malnutrition: Secondary | ICD-10-CM | POA: Diagnosis not present

## 2020-08-17 DIAGNOSIS — R188 Other ascites: Secondary | ICD-10-CM | POA: Diagnosis not present

## 2020-08-17 DIAGNOSIS — Z8669 Personal history of other diseases of the nervous system and sense organs: Secondary | ICD-10-CM | POA: Diagnosis not present

## 2020-08-17 DIAGNOSIS — J90 Pleural effusion, not elsewhere classified: Secondary | ICD-10-CM | POA: Diagnosis not present

## 2020-08-17 DIAGNOSIS — I959 Hypotension, unspecified: Secondary | ICD-10-CM | POA: Diagnosis not present

## 2020-08-18 DIAGNOSIS — K668 Other specified disorders of peritoneum: Secondary | ICD-10-CM | POA: Diagnosis not present

## 2020-08-18 DIAGNOSIS — Z86718 Personal history of other venous thrombosis and embolism: Secondary | ICD-10-CM | POA: Diagnosis not present

## 2020-08-18 DIAGNOSIS — Z8669 Personal history of other diseases of the nervous system and sense organs: Secondary | ICD-10-CM | POA: Diagnosis not present

## 2020-08-18 DIAGNOSIS — K148 Other diseases of tongue: Secondary | ICD-10-CM | POA: Diagnosis not present

## 2020-08-18 DIAGNOSIS — K3532 Acute appendicitis with perforation and localized peritonitis, without abscess: Secondary | ICD-10-CM | POA: Diagnosis not present

## 2020-08-18 DIAGNOSIS — J9690 Respiratory failure, unspecified, unspecified whether with hypoxia or hypercapnia: Secondary | ICD-10-CM | POA: Diagnosis not present

## 2020-08-18 DIAGNOSIS — Z9911 Dependence on respirator [ventilator] status: Secondary | ICD-10-CM | POA: Diagnosis not present

## 2020-08-18 DIAGNOSIS — Z9049 Acquired absence of other specified parts of digestive tract: Secondary | ICD-10-CM | POA: Diagnosis not present

## 2020-08-18 DIAGNOSIS — E440 Moderate protein-calorie malnutrition: Secondary | ICD-10-CM | POA: Diagnosis not present

## 2020-08-19 DIAGNOSIS — Z993 Dependence on wheelchair: Secondary | ICD-10-CM | POA: Diagnosis not present

## 2020-08-19 DIAGNOSIS — K3532 Acute appendicitis with perforation and localized peritonitis, without abscess: Secondary | ICD-10-CM | POA: Diagnosis not present

## 2020-08-19 DIAGNOSIS — R0902 Hypoxemia: Secondary | ICD-10-CM | POA: Diagnosis not present

## 2020-08-19 DIAGNOSIS — K668 Other specified disorders of peritoneum: Secondary | ICD-10-CM | POA: Diagnosis not present

## 2020-08-19 DIAGNOSIS — K148 Other diseases of tongue: Secondary | ICD-10-CM | POA: Diagnosis not present

## 2020-08-19 DIAGNOSIS — J90 Pleural effusion, not elsewhere classified: Secondary | ICD-10-CM | POA: Diagnosis not present

## 2020-08-19 DIAGNOSIS — E876 Hypokalemia: Secondary | ICD-10-CM | POA: Diagnosis not present

## 2020-08-19 DIAGNOSIS — I69954 Hemiplegia and hemiparesis following unspecified cerebrovascular disease affecting left non-dominant side: Secondary | ICD-10-CM | POA: Diagnosis not present

## 2020-08-20 DIAGNOSIS — K668 Other specified disorders of peritoneum: Secondary | ICD-10-CM | POA: Diagnosis not present

## 2020-08-20 DIAGNOSIS — Z79899 Other long term (current) drug therapy: Secondary | ICD-10-CM | POA: Diagnosis not present

## 2020-08-20 DIAGNOSIS — Z993 Dependence on wheelchair: Secondary | ICD-10-CM | POA: Diagnosis not present

## 2020-08-20 DIAGNOSIS — J9811 Atelectasis: Secondary | ICD-10-CM | POA: Diagnosis not present

## 2020-08-20 DIAGNOSIS — R0902 Hypoxemia: Secondary | ICD-10-CM | POA: Diagnosis not present

## 2020-08-20 DIAGNOSIS — J90 Pleural effusion, not elsewhere classified: Secondary | ICD-10-CM | POA: Diagnosis not present

## 2020-08-20 DIAGNOSIS — Z7901 Long term (current) use of anticoagulants: Secondary | ICD-10-CM | POA: Diagnosis not present

## 2020-08-20 DIAGNOSIS — Z8673 Personal history of transient ischemic attack (TIA), and cerebral infarction without residual deficits: Secondary | ICD-10-CM | POA: Diagnosis not present

## 2020-08-20 DIAGNOSIS — K1379 Other lesions of oral mucosa: Secondary | ICD-10-CM | POA: Diagnosis not present

## 2020-08-20 DIAGNOSIS — Z9911 Dependence on respirator [ventilator] status: Secondary | ICD-10-CM | POA: Diagnosis not present

## 2020-08-20 DIAGNOSIS — Z4682 Encounter for fitting and adjustment of non-vascular catheter: Secondary | ICD-10-CM | POA: Diagnosis not present

## 2020-08-20 DIAGNOSIS — E876 Hypokalemia: Secondary | ICD-10-CM | POA: Diagnosis not present

## 2020-08-21 DIAGNOSIS — K3532 Acute appendicitis with perforation and localized peritonitis, without abscess: Secondary | ICD-10-CM | POA: Diagnosis not present

## 2020-08-21 DIAGNOSIS — K148 Other diseases of tongue: Secondary | ICD-10-CM | POA: Diagnosis not present

## 2020-08-21 DIAGNOSIS — I313 Pericardial effusion (noninflammatory): Secondary | ICD-10-CM | POA: Diagnosis not present

## 2020-08-21 DIAGNOSIS — R946 Abnormal results of thyroid function studies: Secondary | ICD-10-CM | POA: Diagnosis not present

## 2020-08-21 DIAGNOSIS — B957 Other staphylococcus as the cause of diseases classified elsewhere: Secondary | ICD-10-CM | POA: Diagnosis not present

## 2020-08-21 DIAGNOSIS — R7881 Bacteremia: Secondary | ICD-10-CM | POA: Diagnosis not present

## 2020-08-21 DIAGNOSIS — E876 Hypokalemia: Secondary | ICD-10-CM | POA: Diagnosis not present

## 2020-08-21 DIAGNOSIS — Z993 Dependence on wheelchair: Secondary | ICD-10-CM | POA: Diagnosis not present

## 2020-08-21 DIAGNOSIS — K668 Other specified disorders of peritoneum: Secondary | ICD-10-CM | POA: Diagnosis not present

## 2020-08-21 DIAGNOSIS — I69998 Other sequelae following unspecified cerebrovascular disease: Secondary | ICD-10-CM | POA: Diagnosis not present

## 2020-08-21 DIAGNOSIS — Z9911 Dependence on respirator [ventilator] status: Secondary | ICD-10-CM | POA: Diagnosis not present

## 2020-08-22 DIAGNOSIS — Z452 Encounter for adjustment and management of vascular access device: Secondary | ICD-10-CM | POA: Diagnosis not present

## 2020-08-22 DIAGNOSIS — Z8673 Personal history of transient ischemic attack (TIA), and cerebral infarction without residual deficits: Secondary | ICD-10-CM | POA: Diagnosis not present

## 2020-08-22 DIAGNOSIS — R7881 Bacteremia: Secondary | ICD-10-CM | POA: Diagnosis not present

## 2020-08-22 DIAGNOSIS — R0902 Hypoxemia: Secondary | ICD-10-CM | POA: Diagnosis not present

## 2020-08-22 DIAGNOSIS — Z9049 Acquired absence of other specified parts of digestive tract: Secondary | ICD-10-CM | POA: Diagnosis not present

## 2020-08-22 DIAGNOSIS — K3532 Acute appendicitis with perforation and localized peritonitis, without abscess: Secondary | ICD-10-CM | POA: Diagnosis not present

## 2020-08-22 DIAGNOSIS — Z9911 Dependence on respirator [ventilator] status: Secondary | ICD-10-CM | POA: Diagnosis not present

## 2020-08-22 DIAGNOSIS — K668 Other specified disorders of peritoneum: Secondary | ICD-10-CM | POA: Diagnosis not present

## 2020-08-22 DIAGNOSIS — K1370 Unspecified lesions of oral mucosa: Secondary | ICD-10-CM | POA: Diagnosis not present

## 2020-08-22 DIAGNOSIS — J90 Pleural effusion, not elsewhere classified: Secondary | ICD-10-CM | POA: Diagnosis not present

## 2020-08-22 DIAGNOSIS — R946 Abnormal results of thyroid function studies: Secondary | ICD-10-CM | POA: Diagnosis not present

## 2020-08-22 DIAGNOSIS — B957 Other staphylococcus as the cause of diseases classified elsewhere: Secondary | ICD-10-CM | POA: Diagnosis not present

## 2020-08-22 DIAGNOSIS — E876 Hypokalemia: Secondary | ICD-10-CM | POA: Diagnosis not present

## 2020-08-23 DIAGNOSIS — J811 Chronic pulmonary edema: Secondary | ICD-10-CM | POA: Diagnosis not present

## 2020-08-23 DIAGNOSIS — R7881 Bacteremia: Secondary | ICD-10-CM | POA: Diagnosis not present

## 2020-08-23 DIAGNOSIS — E8770 Fluid overload, unspecified: Secondary | ICD-10-CM | POA: Diagnosis not present

## 2020-08-23 DIAGNOSIS — J90 Pleural effusion, not elsewhere classified: Secondary | ICD-10-CM | POA: Diagnosis not present

## 2020-08-23 DIAGNOSIS — Z993 Dependence on wheelchair: Secondary | ICD-10-CM | POA: Diagnosis not present

## 2020-08-23 DIAGNOSIS — K3532 Acute appendicitis with perforation and localized peritonitis, without abscess: Secondary | ICD-10-CM | POA: Diagnosis not present

## 2020-08-23 DIAGNOSIS — R946 Abnormal results of thyroid function studies: Secondary | ICD-10-CM | POA: Diagnosis not present

## 2020-08-23 DIAGNOSIS — E876 Hypokalemia: Secondary | ICD-10-CM | POA: Diagnosis not present

## 2020-08-23 DIAGNOSIS — K668 Other specified disorders of peritoneum: Secondary | ICD-10-CM | POA: Diagnosis not present

## 2020-08-23 DIAGNOSIS — B957 Other staphylococcus as the cause of diseases classified elsewhere: Secondary | ICD-10-CM | POA: Diagnosis not present

## 2020-08-23 DIAGNOSIS — I69998 Other sequelae following unspecified cerebrovascular disease: Secondary | ICD-10-CM | POA: Diagnosis not present

## 2020-08-24 DIAGNOSIS — J90 Pleural effusion, not elsewhere classified: Secondary | ICD-10-CM | POA: Diagnosis not present

## 2020-08-24 DIAGNOSIS — I959 Hypotension, unspecified: Secondary | ICD-10-CM | POA: Diagnosis not present

## 2020-08-24 DIAGNOSIS — K668 Other specified disorders of peritoneum: Secondary | ICD-10-CM | POA: Diagnosis not present

## 2020-08-24 DIAGNOSIS — K148 Other diseases of tongue: Secondary | ICD-10-CM | POA: Diagnosis not present

## 2020-08-24 DIAGNOSIS — K3532 Acute appendicitis with perforation and localized peritonitis, without abscess: Secondary | ICD-10-CM | POA: Diagnosis not present

## 2020-08-24 DIAGNOSIS — J9601 Acute respiratory failure with hypoxia: Secondary | ICD-10-CM | POA: Diagnosis not present

## 2020-08-24 DIAGNOSIS — R7881 Bacteremia: Secondary | ICD-10-CM | POA: Diagnosis not present

## 2020-08-24 DIAGNOSIS — Z993 Dependence on wheelchair: Secondary | ICD-10-CM | POA: Diagnosis not present

## 2020-08-24 DIAGNOSIS — B957 Other staphylococcus as the cause of diseases classified elsewhere: Secondary | ICD-10-CM | POA: Diagnosis not present

## 2020-08-24 DIAGNOSIS — R0902 Hypoxemia: Secondary | ICD-10-CM | POA: Diagnosis not present

## 2020-08-24 DIAGNOSIS — I69954 Hemiplegia and hemiparesis following unspecified cerebrovascular disease affecting left non-dominant side: Secondary | ICD-10-CM | POA: Diagnosis not present

## 2020-08-24 DIAGNOSIS — R946 Abnormal results of thyroid function studies: Secondary | ICD-10-CM | POA: Diagnosis not present

## 2020-08-25 DIAGNOSIS — E876 Hypokalemia: Secondary | ICD-10-CM | POA: Diagnosis not present

## 2020-08-25 DIAGNOSIS — J90 Pleural effusion, not elsewhere classified: Secondary | ICD-10-CM | POA: Diagnosis not present

## 2020-08-25 DIAGNOSIS — B957 Other staphylococcus as the cause of diseases classified elsewhere: Secondary | ICD-10-CM | POA: Diagnosis not present

## 2020-08-25 DIAGNOSIS — K148 Other diseases of tongue: Secondary | ICD-10-CM | POA: Diagnosis not present

## 2020-08-25 DIAGNOSIS — I69954 Hemiplegia and hemiparesis following unspecified cerebrovascular disease affecting left non-dominant side: Secondary | ICD-10-CM | POA: Diagnosis not present

## 2020-08-25 DIAGNOSIS — J9601 Acute respiratory failure with hypoxia: Secondary | ICD-10-CM | POA: Diagnosis not present

## 2020-08-25 DIAGNOSIS — D649 Anemia, unspecified: Secondary | ICD-10-CM | POA: Diagnosis not present

## 2020-08-25 DIAGNOSIS — I959 Hypotension, unspecified: Secondary | ICD-10-CM | POA: Diagnosis not present

## 2020-08-25 DIAGNOSIS — K3532 Acute appendicitis with perforation and localized peritonitis, without abscess: Secondary | ICD-10-CM | POA: Diagnosis not present

## 2020-08-25 DIAGNOSIS — K668 Other specified disorders of peritoneum: Secondary | ICD-10-CM | POA: Diagnosis not present

## 2020-08-25 DIAGNOSIS — R7881 Bacteremia: Secondary | ICD-10-CM | POA: Diagnosis not present

## 2020-08-26 DIAGNOSIS — K668 Other specified disorders of peritoneum: Secondary | ICD-10-CM | POA: Diagnosis not present

## 2020-08-26 DIAGNOSIS — D649 Anemia, unspecified: Secondary | ICD-10-CM | POA: Diagnosis not present

## 2020-08-26 DIAGNOSIS — E876 Hypokalemia: Secondary | ICD-10-CM | POA: Diagnosis not present

## 2020-08-26 DIAGNOSIS — I959 Hypotension, unspecified: Secondary | ICD-10-CM | POA: Diagnosis not present

## 2020-08-26 DIAGNOSIS — J9601 Acute respiratory failure with hypoxia: Secondary | ICD-10-CM | POA: Diagnosis not present

## 2020-08-26 DIAGNOSIS — B957 Other staphylococcus as the cause of diseases classified elsewhere: Secondary | ICD-10-CM | POA: Diagnosis not present

## 2020-08-26 DIAGNOSIS — R7881 Bacteremia: Secondary | ICD-10-CM | POA: Diagnosis not present

## 2020-08-26 DIAGNOSIS — K148 Other diseases of tongue: Secondary | ICD-10-CM | POA: Diagnosis not present

## 2020-08-26 DIAGNOSIS — J90 Pleural effusion, not elsewhere classified: Secondary | ICD-10-CM | POA: Diagnosis not present

## 2020-08-26 DIAGNOSIS — K3532 Acute appendicitis with perforation and localized peritonitis, without abscess: Secondary | ICD-10-CM | POA: Diagnosis not present

## 2020-08-26 DIAGNOSIS — I69954 Hemiplegia and hemiparesis following unspecified cerebrovascular disease affecting left non-dominant side: Secondary | ICD-10-CM | POA: Diagnosis not present

## 2020-08-27 DIAGNOSIS — B957 Other staphylococcus as the cause of diseases classified elsewhere: Secondary | ICD-10-CM | POA: Diagnosis not present

## 2020-08-27 DIAGNOSIS — R7881 Bacteremia: Secondary | ICD-10-CM | POA: Diagnosis not present

## 2020-08-27 DIAGNOSIS — E876 Hypokalemia: Secondary | ICD-10-CM | POA: Diagnosis not present

## 2020-08-27 DIAGNOSIS — D649 Anemia, unspecified: Secondary | ICD-10-CM | POA: Diagnosis not present

## 2020-08-27 DIAGNOSIS — J90 Pleural effusion, not elsewhere classified: Secondary | ICD-10-CM | POA: Diagnosis not present

## 2020-08-27 DIAGNOSIS — Z4682 Encounter for fitting and adjustment of non-vascular catheter: Secondary | ICD-10-CM | POA: Diagnosis not present

## 2020-08-27 DIAGNOSIS — K1370 Unspecified lesions of oral mucosa: Secondary | ICD-10-CM | POA: Diagnosis not present

## 2020-08-27 DIAGNOSIS — J9601 Acute respiratory failure with hypoxia: Secondary | ICD-10-CM | POA: Diagnosis not present

## 2020-08-27 DIAGNOSIS — E8770 Fluid overload, unspecified: Secondary | ICD-10-CM | POA: Diagnosis not present

## 2020-08-27 DIAGNOSIS — K668 Other specified disorders of peritoneum: Secondary | ICD-10-CM | POA: Diagnosis not present

## 2020-08-27 DIAGNOSIS — K3532 Acute appendicitis with perforation and localized peritonitis, without abscess: Secondary | ICD-10-CM | POA: Diagnosis not present

## 2020-08-28 DIAGNOSIS — I313 Pericardial effusion (noninflammatory): Secondary | ICD-10-CM | POA: Diagnosis not present

## 2020-08-28 DIAGNOSIS — K668 Other specified disorders of peritoneum: Secondary | ICD-10-CM | POA: Diagnosis not present

## 2020-08-28 DIAGNOSIS — R778 Other specified abnormalities of plasma proteins: Secondary | ICD-10-CM | POA: Diagnosis not present

## 2020-08-28 DIAGNOSIS — Z993 Dependence on wheelchair: Secondary | ICD-10-CM | POA: Diagnosis not present

## 2020-08-28 DIAGNOSIS — Z452 Encounter for adjustment and management of vascular access device: Secondary | ICD-10-CM | POA: Diagnosis not present

## 2020-08-28 DIAGNOSIS — B957 Other staphylococcus as the cause of diseases classified elsewhere: Secondary | ICD-10-CM | POA: Diagnosis not present

## 2020-08-28 DIAGNOSIS — Z4682 Encounter for fitting and adjustment of non-vascular catheter: Secondary | ICD-10-CM | POA: Diagnosis not present

## 2020-08-28 DIAGNOSIS — Z8673 Personal history of transient ischemic attack (TIA), and cerebral infarction without residual deficits: Secondary | ICD-10-CM | POA: Diagnosis not present

## 2020-08-28 DIAGNOSIS — R946 Abnormal results of thyroid function studies: Secondary | ICD-10-CM | POA: Diagnosis not present

## 2020-08-28 DIAGNOSIS — J9601 Acute respiratory failure with hypoxia: Secondary | ICD-10-CM | POA: Diagnosis not present

## 2020-08-28 DIAGNOSIS — K3532 Acute appendicitis with perforation and localized peritonitis, without abscess: Secondary | ICD-10-CM | POA: Diagnosis not present

## 2020-08-28 DIAGNOSIS — K6389 Other specified diseases of intestine: Secondary | ICD-10-CM | POA: Diagnosis not present

## 2020-08-28 DIAGNOSIS — J90 Pleural effusion, not elsewhere classified: Secondary | ICD-10-CM | POA: Diagnosis not present

## 2020-08-28 DIAGNOSIS — N200 Calculus of kidney: Secondary | ICD-10-CM | POA: Diagnosis not present

## 2020-08-28 DIAGNOSIS — E876 Hypokalemia: Secondary | ICD-10-CM | POA: Diagnosis not present

## 2020-08-28 DIAGNOSIS — D649 Anemia, unspecified: Secondary | ICD-10-CM | POA: Diagnosis not present

## 2020-08-29 DIAGNOSIS — J9601 Acute respiratory failure with hypoxia: Secondary | ICD-10-CM | POA: Diagnosis not present

## 2020-08-29 DIAGNOSIS — D72829 Elevated white blood cell count, unspecified: Secondary | ICD-10-CM | POA: Diagnosis not present

## 2020-08-29 DIAGNOSIS — K668 Other specified disorders of peritoneum: Secondary | ICD-10-CM | POA: Diagnosis not present

## 2020-08-29 DIAGNOSIS — R946 Abnormal results of thyroid function studies: Secondary | ICD-10-CM | POA: Diagnosis not present

## 2020-08-29 DIAGNOSIS — D649 Anemia, unspecified: Secondary | ICD-10-CM | POA: Diagnosis not present

## 2020-08-29 DIAGNOSIS — Z8673 Personal history of transient ischemic attack (TIA), and cerebral infarction without residual deficits: Secondary | ICD-10-CM | POA: Diagnosis not present

## 2020-08-29 DIAGNOSIS — Z66 Do not resuscitate: Secondary | ICD-10-CM | POA: Diagnosis not present

## 2020-08-29 DIAGNOSIS — K148 Other diseases of tongue: Secondary | ICD-10-CM | POA: Diagnosis not present

## 2020-08-29 DIAGNOSIS — J90 Pleural effusion, not elsewhere classified: Secondary | ICD-10-CM | POA: Diagnosis not present

## 2020-08-29 DIAGNOSIS — B957 Other staphylococcus as the cause of diseases classified elsewhere: Secondary | ICD-10-CM | POA: Diagnosis not present

## 2020-08-29 DIAGNOSIS — K3532 Acute appendicitis with perforation and localized peritonitis, without abscess: Secondary | ICD-10-CM | POA: Diagnosis not present

## 2020-08-30 DIAGNOSIS — R778 Other specified abnormalities of plasma proteins: Secondary | ICD-10-CM | POA: Diagnosis not present

## 2020-08-30 DIAGNOSIS — R0602 Shortness of breath: Secondary | ICD-10-CM | POA: Diagnosis not present

## 2020-08-30 DIAGNOSIS — J9601 Acute respiratory failure with hypoxia: Secondary | ICD-10-CM | POA: Diagnosis not present

## 2020-08-30 DIAGNOSIS — K668 Other specified disorders of peritoneum: Secondary | ICD-10-CM | POA: Diagnosis not present

## 2020-08-30 DIAGNOSIS — I10 Essential (primary) hypertension: Secondary | ICD-10-CM | POA: Diagnosis not present

## 2020-08-30 DIAGNOSIS — B957 Other staphylococcus as the cause of diseases classified elsewhere: Secondary | ICD-10-CM | POA: Diagnosis not present

## 2020-08-30 DIAGNOSIS — N179 Acute kidney failure, unspecified: Secondary | ICD-10-CM | POA: Diagnosis not present

## 2020-08-30 DIAGNOSIS — K1370 Unspecified lesions of oral mucosa: Secondary | ICD-10-CM | POA: Diagnosis not present

## 2020-08-30 DIAGNOSIS — J90 Pleural effusion, not elsewhere classified: Secondary | ICD-10-CM | POA: Diagnosis not present

## 2020-08-30 DIAGNOSIS — J811 Chronic pulmonary edema: Secondary | ICD-10-CM | POA: Diagnosis not present

## 2020-08-30 DIAGNOSIS — I517 Cardiomegaly: Secondary | ICD-10-CM | POA: Diagnosis not present

## 2020-08-30 DIAGNOSIS — K3532 Acute appendicitis with perforation and localized peritonitis, without abscess: Secondary | ICD-10-CM | POA: Diagnosis not present

## 2020-08-30 DIAGNOSIS — Z9049 Acquired absence of other specified parts of digestive tract: Secondary | ICD-10-CM | POA: Diagnosis not present

## 2020-08-30 DIAGNOSIS — R946 Abnormal results of thyroid function studies: Secondary | ICD-10-CM | POA: Diagnosis not present

## 2020-08-30 DIAGNOSIS — D649 Anemia, unspecified: Secondary | ICD-10-CM | POA: Diagnosis not present

## 2020-08-31 DIAGNOSIS — K668 Other specified disorders of peritoneum: Secondary | ICD-10-CM | POA: Diagnosis not present

## 2020-08-31 DIAGNOSIS — D649 Anemia, unspecified: Secondary | ICD-10-CM | POA: Diagnosis not present

## 2020-08-31 DIAGNOSIS — K3532 Acute appendicitis with perforation and localized peritonitis, without abscess: Secondary | ICD-10-CM | POA: Diagnosis not present

## 2020-08-31 DIAGNOSIS — J90 Pleural effusion, not elsewhere classified: Secondary | ICD-10-CM | POA: Diagnosis not present

## 2020-08-31 DIAGNOSIS — J9811 Atelectasis: Secondary | ICD-10-CM | POA: Diagnosis not present

## 2020-08-31 DIAGNOSIS — B957 Other staphylococcus as the cause of diseases classified elsewhere: Secondary | ICD-10-CM | POA: Diagnosis not present

## 2020-08-31 DIAGNOSIS — R778 Other specified abnormalities of plasma proteins: Secondary | ICD-10-CM | POA: Diagnosis not present

## 2020-08-31 DIAGNOSIS — N179 Acute kidney failure, unspecified: Secondary | ICD-10-CM | POA: Diagnosis not present

## 2020-08-31 DIAGNOSIS — R946 Abnormal results of thyroid function studies: Secondary | ICD-10-CM | POA: Diagnosis not present

## 2020-08-31 DIAGNOSIS — K1370 Unspecified lesions of oral mucosa: Secondary | ICD-10-CM | POA: Diagnosis not present

## 2020-08-31 DIAGNOSIS — Z9049 Acquired absence of other specified parts of digestive tract: Secondary | ICD-10-CM | POA: Diagnosis not present

## 2020-08-31 DIAGNOSIS — J9601 Acute respiratory failure with hypoxia: Secondary | ICD-10-CM | POA: Diagnosis not present

## 2020-09-01 DIAGNOSIS — K3532 Acute appendicitis with perforation and localized peritonitis, without abscess: Secondary | ICD-10-CM | POA: Diagnosis not present

## 2020-09-01 DIAGNOSIS — K668 Other specified disorders of peritoneum: Secondary | ICD-10-CM | POA: Diagnosis not present

## 2020-09-01 DIAGNOSIS — I69954 Hemiplegia and hemiparesis following unspecified cerebrovascular disease affecting left non-dominant side: Secondary | ICD-10-CM | POA: Diagnosis not present

## 2020-09-01 DIAGNOSIS — E43 Unspecified severe protein-calorie malnutrition: Secondary | ICD-10-CM | POA: Diagnosis not present

## 2020-09-01 DIAGNOSIS — Z86718 Personal history of other venous thrombosis and embolism: Secondary | ICD-10-CM | POA: Diagnosis not present

## 2020-09-01 DIAGNOSIS — N179 Acute kidney failure, unspecified: Secondary | ICD-10-CM | POA: Diagnosis not present

## 2020-09-01 DIAGNOSIS — E8809 Other disorders of plasma-protein metabolism, not elsewhere classified: Secondary | ICD-10-CM | POA: Diagnosis not present

## 2020-09-07 ENCOUNTER — Ambulatory Visit: Payer: Medicare Other | Admitting: Urology

## 2020-09-14 DEATH — deceased

## 2020-09-19 ENCOUNTER — Encounter: Payer: Self-pay | Admitting: Urology

## 2021-11-23 IMAGING — CT CT NECK W/ CM
3 of 4 series · 13 of 33 positions shown, 16 images · IV contrast (Omnipaque or Isovue)
Comparison: None.

CLINICAL DATA: Right-sided neck mass.

EXAM:
CT NECK WITH CONTRAST
TECHNIQUE: Multidetector CT imaging of the neck was performed using the
standard protocol following the bolus administration of intravenous
contrast.
CONTRAST:  75mL OMNIPAQUE IOHEXOL 300 MG/ML  SOLN

[Series 4: cor neck · coronal · 0.38mm/px · 3 of 118 slices shown]
[im 24/118  bone]
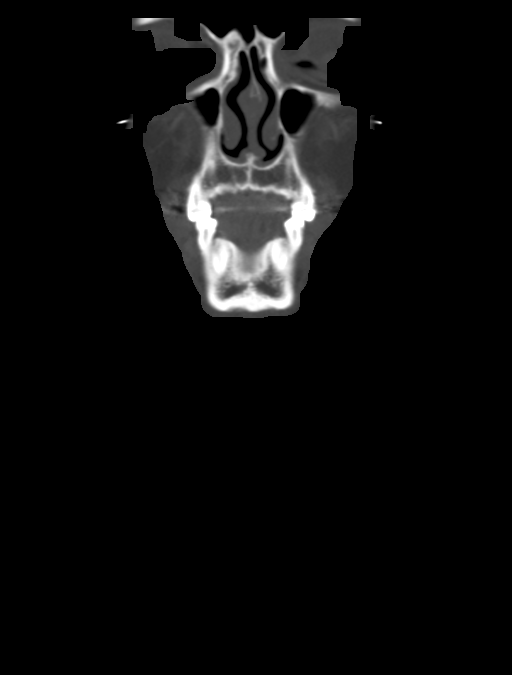
[im 47/118  bone]
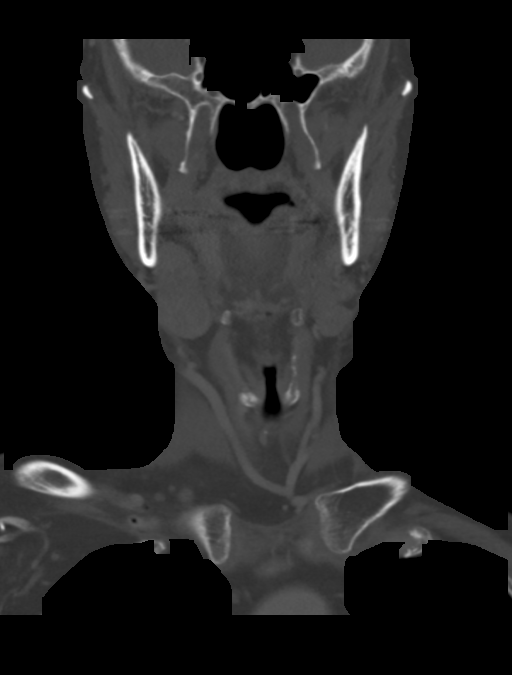
[im 71/118  bone]
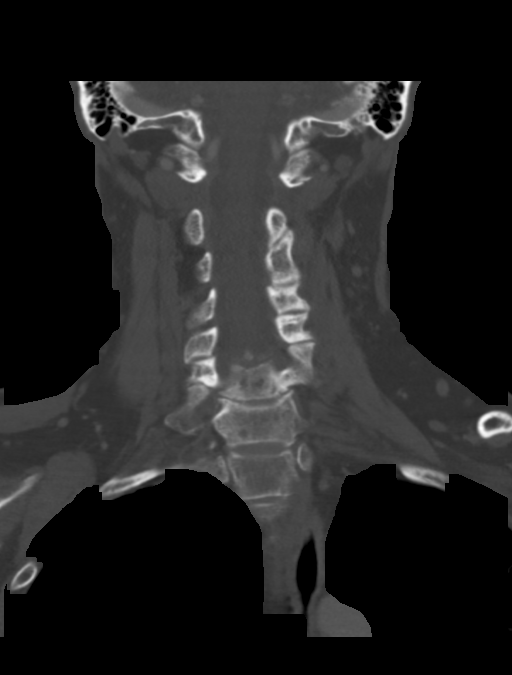

[Series 5: sag neck · sagittal · 0.49mm/px · 5 of 101 slices shown, 6 images]
[im 34/101  bone]
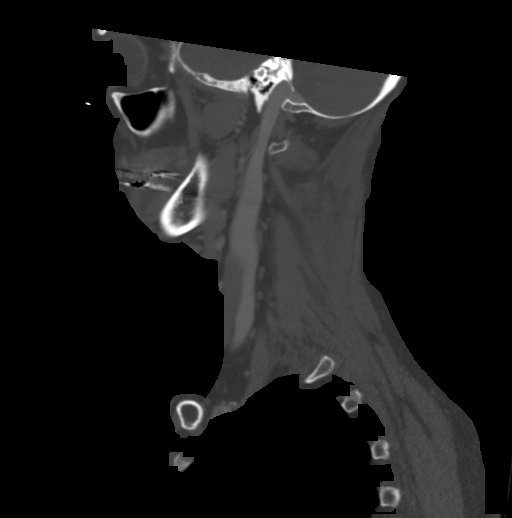
[im 42/101  bone]
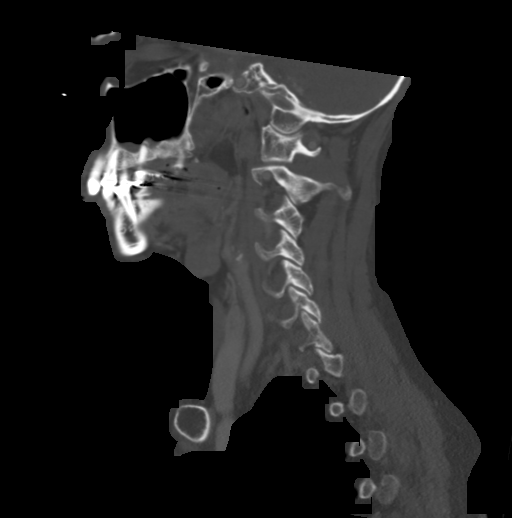
[im 51/101  soft-tissue]
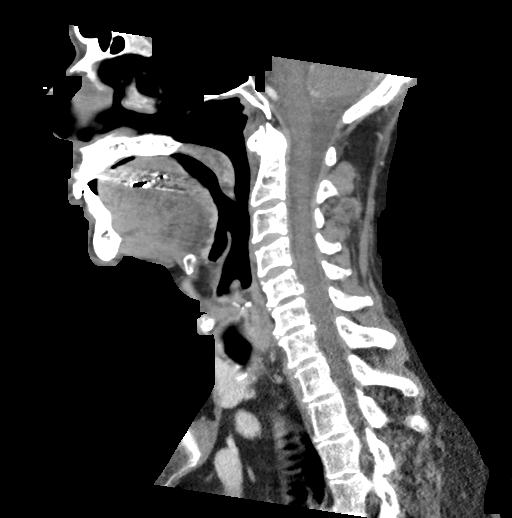
[im 51/101  bone]
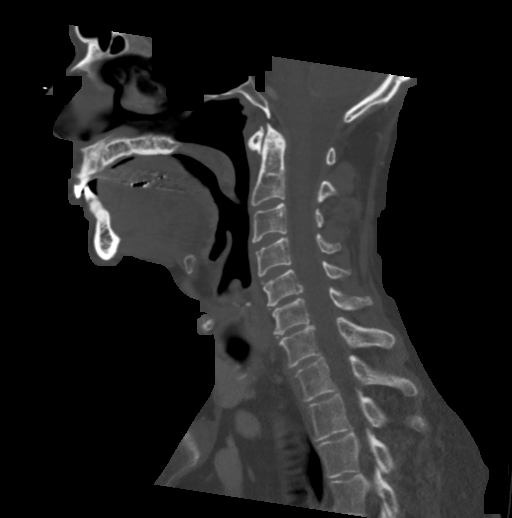
[im 59/101  bone]
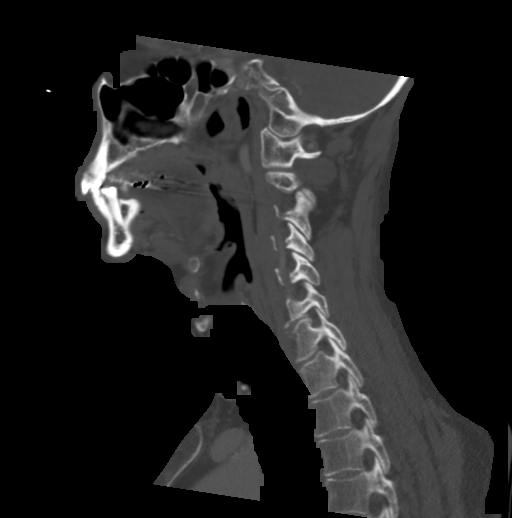
[im 67/101  bone]
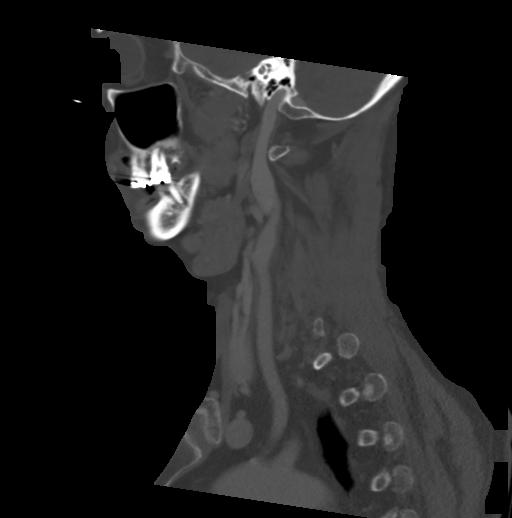

[Series 6: ax oropharynx · axial · 0.39mm/px · z∈[-296,-133]mm · 5 of 124 slices shown, 7 images]
[im 21/124  soft-tissue]
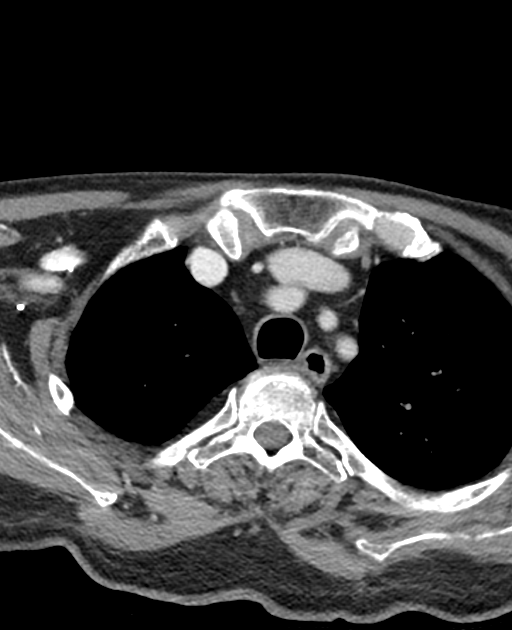
[im 21/124  bone]
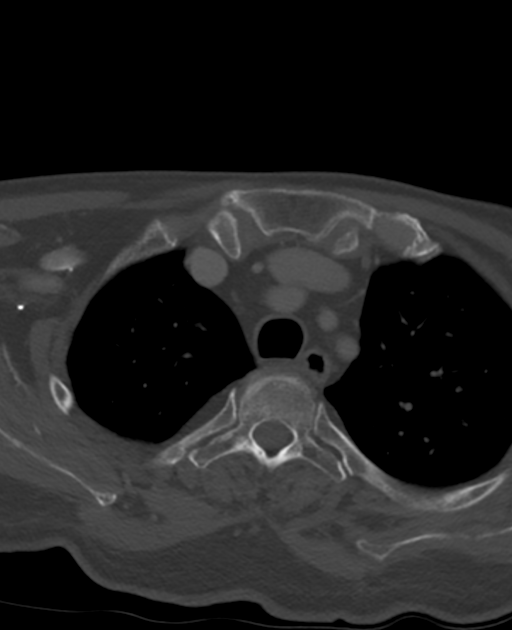
[im 42/124  bone]
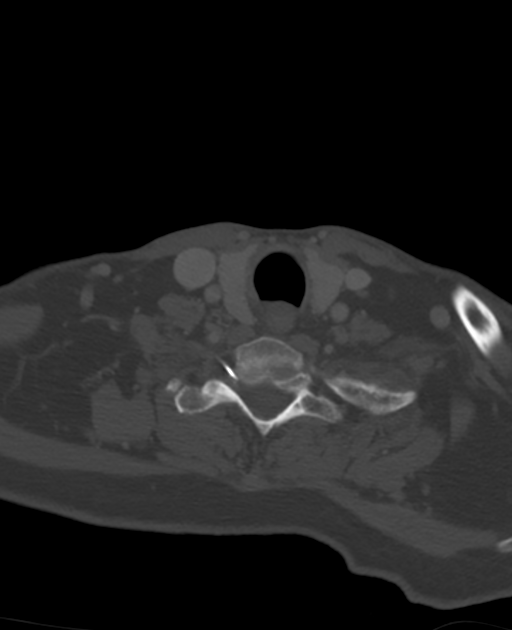
[im 62/124  bone]
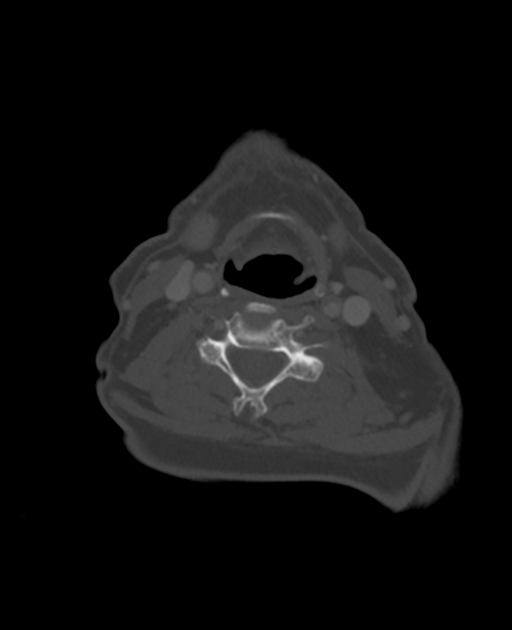
[im 83/124  bone]
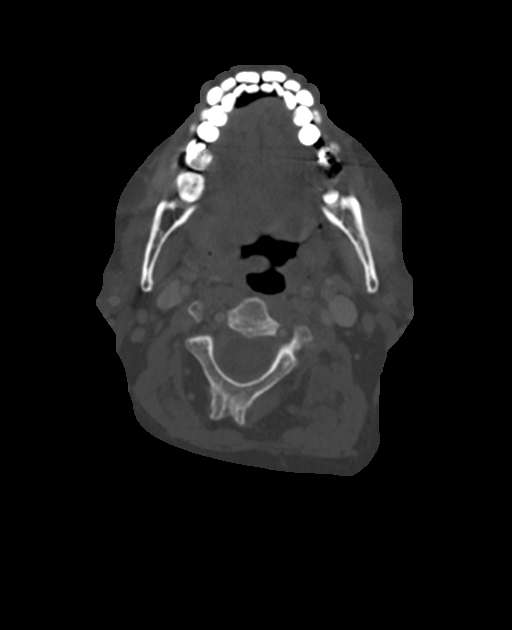
[im 103/124  soft-tissue]
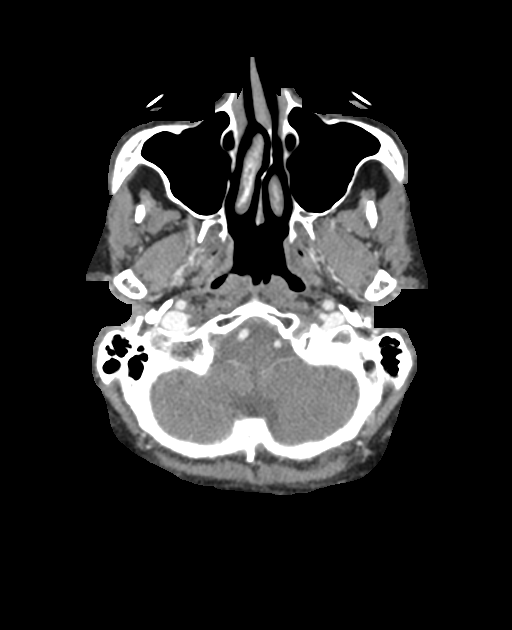
[im 103/124  bone]
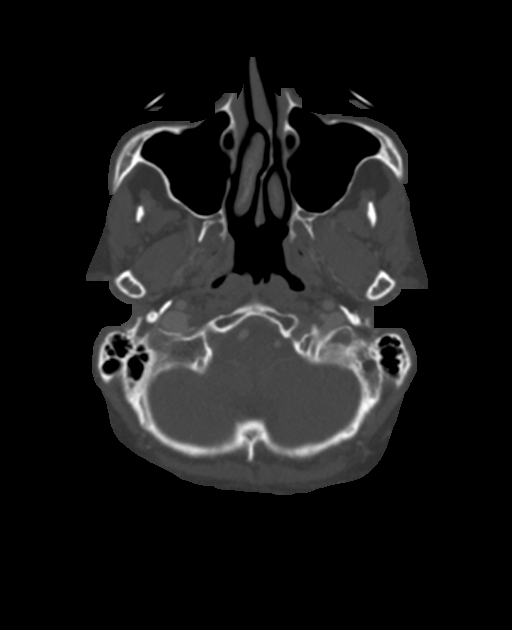

[13 of 33 positions shown; findings below may reference images not displayed]

FINDINGS: Pharynx and larynx: No evidence of mucosal or submucosal mass
lesion.

Salivary glands: Submandibular glands are normal. Parotid glands are
normal.

Thyroid: Normal

Lymph nodes: No enlarged or low-density nodes on either side of the
neck. Normal size cervical chain nodes.

Vascular: Atherosclerotic change at the carotid bifurcations.
Stenosis of the right ICA in the bulb with minimal diameter of
mm. This could be a hemodynamically significant stenosis.

Limited intracranial: Normal

Visualized orbits: Normal

Mastoids and visualized paranasal sinuses: Clear

Skeleton: Ordinary cervical spondylosis.

Upper chest: Normal

Other: None
IMPRESSION: 1. No evidence of mass or lymphadenopathy.
2. Atherosclerotic disease at the carotid bifurcations. Stenosis of
the right ICA in the bulb with minimal diameter of 1.8 mm. This
could be a hemodynamically significant stenosis.
